# Patient Record
Sex: Male | Born: 1945 | Race: Black or African American | Hispanic: No | Marital: Married | Smoking: Former smoker
Health system: Southern US, Community
[De-identification: ages and names within clinical notes are randomized; demographics above are authoritative.]

## PROBLEM LIST (undated history)

## (undated) DIAGNOSIS — M1990 Unspecified osteoarthritis, unspecified site: Secondary | ICD-10-CM

## (undated) DIAGNOSIS — E119 Type 2 diabetes mellitus without complications: Secondary | ICD-10-CM

## (undated) DIAGNOSIS — I10 Essential (primary) hypertension: Secondary | ICD-10-CM

## (undated) DIAGNOSIS — E785 Hyperlipidemia, unspecified: Secondary | ICD-10-CM

## (undated) DIAGNOSIS — I209 Angina pectoris, unspecified: Secondary | ICD-10-CM

## (undated) HISTORY — DX: Angina pectoris, unspecified: I20.9

## (undated) HISTORY — PX: CARDIAC CATHETERIZATION: SHX172

## (undated) HISTORY — PX: OTHER SURGICAL HISTORY: SHX169

## (undated) HISTORY — DX: Essential (primary) hypertension: I10

## (undated) HISTORY — DX: Hyperlipidemia, unspecified: E78.5

## (undated) HISTORY — PX: ROTATOR CUFF REPAIR: SHX139

---

## 2001-11-20 ENCOUNTER — Encounter: Payer: Self-pay | Admitting: Cardiology

## 2001-11-20 ENCOUNTER — Encounter: Admission: RE | Admit: 2001-11-20 | Discharge: 2001-11-20 | Payer: Self-pay | Admitting: Cardiology

## 2002-03-28 ENCOUNTER — Encounter: Payer: Self-pay | Admitting: Cardiology

## 2002-03-28 ENCOUNTER — Ambulatory Visit (HOSPITAL_COMMUNITY): Admission: RE | Admit: 2002-03-28 | Discharge: 2002-03-28 | Payer: Self-pay | Admitting: Cardiology

## 2003-12-10 ENCOUNTER — Encounter (HOSPITAL_COMMUNITY): Admission: RE | Admit: 2003-12-10 | Discharge: 2004-03-09 | Payer: Self-pay | Admitting: Cardiology

## 2003-12-22 ENCOUNTER — Ambulatory Visit (HOSPITAL_COMMUNITY): Admission: RE | Admit: 2003-12-22 | Discharge: 2003-12-23 | Payer: Self-pay | Admitting: Cardiology

## 2004-01-11 ENCOUNTER — Encounter (HOSPITAL_COMMUNITY): Admission: RE | Admit: 2004-01-11 | Discharge: 2004-03-23 | Payer: Self-pay | Admitting: Cardiology

## 2004-10-26 ENCOUNTER — Ambulatory Visit (HOSPITAL_COMMUNITY): Admission: RE | Admit: 2004-10-26 | Discharge: 2004-10-26 | Payer: Self-pay | Admitting: Gastroenterology

## 2004-11-10 ENCOUNTER — Ambulatory Visit (HOSPITAL_COMMUNITY): Admission: RE | Admit: 2004-11-10 | Discharge: 2004-11-10 | Payer: Self-pay | Admitting: Cardiology

## 2005-04-03 ENCOUNTER — Ambulatory Visit (HOSPITAL_COMMUNITY): Admission: RE | Admit: 2005-04-03 | Discharge: 2005-04-03 | Payer: Self-pay | Admitting: Cardiology

## 2008-03-10 ENCOUNTER — Inpatient Hospital Stay (HOSPITAL_COMMUNITY): Admission: EM | Admit: 2008-03-10 | Discharge: 2008-03-13 | Payer: Self-pay | Admitting: Emergency Medicine

## 2008-08-11 ENCOUNTER — Ambulatory Visit (HOSPITAL_COMMUNITY): Admission: RE | Admit: 2008-08-11 | Discharge: 2008-08-11 | Payer: Self-pay | Admitting: Cardiology

## 2009-12-30 ENCOUNTER — Emergency Department (HOSPITAL_COMMUNITY): Admission: EM | Admit: 2009-12-30 | Discharge: 2009-12-30 | Payer: Self-pay | Admitting: Family Medicine

## 2010-06-26 ENCOUNTER — Ambulatory Visit: Payer: Self-pay | Admitting: Gastroenterology

## 2010-06-26 ENCOUNTER — Ambulatory Visit (HOSPITAL_COMMUNITY): Admission: EM | Admit: 2010-06-26 | Discharge: 2010-06-26 | Payer: Self-pay | Admitting: Emergency Medicine

## 2010-06-26 ENCOUNTER — Emergency Department (HOSPITAL_COMMUNITY): Admission: EM | Admit: 2010-06-26 | Discharge: 2010-06-26 | Payer: Self-pay | Admitting: Emergency Medicine

## 2010-11-24 NOTE — Procedures (Signed)
Summary: Upper Endoscopy  Patient: Logan Cisneros Note: All result statuses are Final unless otherwise noted.  Tests: (1) Upper Endoscopy (EGD)   EGD Upper Endoscopy       DONE     [facility]     [address]     [place], [place]  [address]           ENDOSCOPY PROCEDURE REPORT     PATIENT:  [name], [name]  MR#:  [id number]     BIRTHDATE:  March 29, 1946, 64 yrs. old  GENDER:  male     ENDOSCOPIST:  Judie Petit T. Russella Dar, MD, [facility]           PROCEDURE DATE:  06/26/2010     PROCEDURE:  EGD with foreign body removal, EGD with dilatation     over guidewire     ASA CLASS:  Class II     INDICATIONS:  food impaction, dysphagia     MEDICATIONS:  Fentanyl 75 mcg IV, Versed 7 mg IV     TOPICAL ANESTHETIC:  Cetacaine Spray     DESCRIPTION OF PROCEDURE:   After the risks benefits and     alternatives of the procedure were thoroughly explained, informed     consent was obtained.  The [id number] endoscope was     introduced through the mouth and advanced to the second portion of     the duodenum, limited by Retained food in the stomach.   The     instrument was slowly withdrawn as the mucosa was fully examined.     <<PROCEDUREIMAGES>>     Retained meat was present in the distal esophagus. It was removed     orally with the tripod grasper and the roth basket. There were mid     esophageal abrasions and post pharyngeal abrasions with very mild     bleeding likely from the grasper and roth basket. Retained food     was present in the body of the stomach which limited the exam.     Mild erosive gastritis was found in the antrum. The stomach was     entered and closely examined. The antrum, angularis, and lesser     curvature were fairly well visualized, including a retroflexed     view of the cardia and fundus. The stomach wall was normally     distensable. The scope passed easily through the pylorus into the     duodenum. The duodenal bulb was normal in appearance, as  was the     postbulbar duodenum.  A stricture was found at the     gastroesophageal junction. It was circumferential and benign     appearing. It was 15 mm in diameter, savary / guidewire, 17mm     passed with minimal resistance and moderate heme likley related to     the abrasions described. Retroflexed views revealed no     abnormalities. The scope was then withdrawn from the patient and     the procedure completed.           COMPLICATIONS:  None     ENDOSCOPIC IMPRESSION:     1) Food, retained in the distal esophagus-removed     2) Food, retained in the body of the stomach     3) Mild antral gastritis     4) Stricture at the gastroesophageal junction           RECOMMENDATIONS:     1) Anti-reflux regimen  2) PPI qam; Prilosec 20mg  OTC qam     3) OP follow-up in 2 weeks with Dr. Elnoria Howard     4) Post dilation instructions and gargle with warm salt water     later tonight and tomororw           Kavish Lafitte T. Russella Dar, MD, [facility]           CC:  Jeani Hawking, MD           n.     Rosalie DoctorVenita Lick. Zayah Keilman at 06/26/2010 09:51 PM           [name], [id number]  Note: An exclamation mark (!) indicates a result that was not dispersed into the flowsheet. Document Creation Date: 06/28/2010 9:50 AM _______________________________________________________________________  (1) Order result status: Final Collection or observation date-time: 06/26/2010 21:38 Requested date-time:  Receipt date-time:  Reported date-time:  Referring Physician:   Ordering Physician: Claudette Head [phone]) Specimen Source:  Source: Launa Grill Order Number: [id number] Lab site:

## 2011-03-07 NOTE — Discharge Summary (Signed)
Logan Cisneros, Logan Cisneros             ACCOUNT NO.:  [id number]   MEDICAL RECORD NO.:  [id number]          PATIENT TYPE:  INP   LOCATION:  6525                         FACILITY:  [facility]   PHYSICIAN:  Mohan N. Sharyn Lull, M.D. DATE OF BIRTH:  1946-07-22   DATE OF ADMISSION:  03/10/2008  DATE OF DISCHARGE:  03/13/2008                               DISCHARGE SUMMARY   ADMITTING DIAGNOSES:  1. Chest pain, new-onset angina, rule out myocardial infarction.  2. Status post recent acute upper gastrointestinal bleeding, rule out      peptic ulcer disease, rule out gastritis.  3. Coronary artery disease.  4. Hypertension.  5. Diabetes mellitus.  6. Hypercholesterolemia.  7. Morbid obesity.   DISCHARGE DIAGNOSES:  1. Stable angina, myocardial infarction ruled out.  2. Coronary artery disease, status post percutaneous coronary      intervention to left anterior descending artery in the past.  3. Status post recent acute upper gastrointestinal bleeding secondary      to gastric ulcers.  4. Anemia secondary to above.  5. Hypertension.  6. Non-insulin-dependent diabetes mellitus.  7. Hypercholesterolemia.  8. Morbid obesity.  9. Status post recent nonsteroidal anti-inflammatory drug use.   DISCHARGE HOME MEDICATIONS:  1. Lopressor 25 mg 1 tablet twice daily.  2. Altace 10 mg 1 capsule daily.  3. Lipitor 80 mg 1 tablet daily.  4. Protonix 40 mg 1 tablet daily.  5. Glumetza 500 mg 1 tablet daily with food.  6. Nitrolingual spray 0.4 mg sublingually, use as directed.  7. Feosol 1 tablet twice daily.   ACTIVITY:  As tolerated.   DIET:  Low-salt, low-cholesterol, 1800-calorie ADA diet.   The patient has been advised to avoid heavy meals and any exertion.  The  patient also has been advised to stop aspirin, Plavix, and NSAIDs.  The  patient also has been advised if he feels dizzy, lightheaded, weak, or  notices black tarry stools, he should call 911 immediately.  Follow up  with me in 1 week.   Monitor blood sugar daily.  CBC in 1 week.   CONDITION AT DISCHARGE:  Stable.   BRIEF HISTORY AND HOSPITAL COURSE:  [name] is a 65 year old black  male with past medical history significant for coronary artery disease,  history of MI in the past, status post PTCA with stenting to LAD in  March 2005, hypertension, hypercholesterolemia, morbid obesity, and non-  insulin-dependent diabetes mellitus, complains of retrosternal chest  pain off and on since yesterday described as pressure grade 2-3/10  without associated nausea, vomiting, or diaphoresis.  He states last  week he had black tarry stools and felt weak and dizzy associated with  abdominal discomfort, but did not seek any medical attention.  He states  for the last few days now his stool is brown.  Denies any PND,  orthopnea, or leg swelling.  Denies palpitation, lightheadedness, or  syncope.  He states he took Aleve for few days for left hip pain.  Lately also he states gets short of breath with minimal exertion.  Denies any cough, fevers, or chills.   PAST MEDICAL HISTORY:  As above.   PAST SURGICAL HISTORY:  He had bilateral rotator cuff surgery in the  past and had tonsillectomy at young age.   ALLERGIES:  No known drug allergies.   MEDICATIONS AT HOME:  He was on enteric-coated aspirin 81 mg p.o. daily,  Plavix 75 mg daily, Aleve, he took for few days, stopped last week,  Altace 10 mg daily, metoprolol 25 mg twice daily, Lipitor 80 mg p.o.  daily, and glumetza 500 mg daily.   SOCIAL HISTORY:  He is married, 3 children, smoked 1 pack per week, quit  in 1970s, retired from [employer], presently works as a Runner, broadcasting/film/video at [employer].   FAMILY HISTORY:  Positive for cancer.   PHYSICAL EXAMINATION:  GENERAL:  On examination, he was alert, awake,  oriented x3, in no acute distress.  VITAL SIGNS:  Blood pressure was 124/72 and pulse was 65 and regular.  HEENT:  Conjunctivae pink.  NECK:  Supple.  No JVD.  No bruits.  LUNGS:   Clear to auscultation without rhonchi and rales.  CARDIOVASCULAR:  S1 and S2 was normal.  There was a soft systolic  murmur.  There was no S3 gallop.  ABDOMEN:  Soft.  Bowel sounds are present.  Nontender.  EXTREMITIES:  There is no clubbing, cyanosis, or edema.   LABORATORY DATA:  His hemoglobin was 9.5, hematocrit 28.4, and white  count of 8.5.  Glucose 115.  Two sets of cardiac enzymes were negative.  EKG showed normal sinus rhythm with no acute ischemic changes.   BRIEF HOSPITAL COURSE:  The patient was admitted to telemetry unit.  MI  was ruled out by serial enzymes and EKG.  Due to recent acute upper GI  bleeding, GI consultation was obtained with Dr. Loreta Ave.  The patient  subsequently underwent upper endoscopy, which showed gastric ulcers and  duodenitis.  The patient's aspirin and Plavix were stopped.  Also  heparin was stopped.  The patient did not have any episodes of GI  bleeding during the hospital stay.  His hemoglobin remained stable  during the hospital stay.  The patient is started on PPI and Feosol.  The patient did not have any further episodes of chest pain during the  hospital stay.  His EKG remained stable.  The  patient is eager to go home, will be discharged home, and will be  followed up in my office in 1 week.  We will discuss with the patient an  outpatient cardiac workup, i.e., noninvasive stress testing versus left  cath in few weeks once the gastric ulcer is healed.      Eduardo Osier. Sharyn Lull, M.D.  Electronically Signed     MNH/MEDQ  D:  03/13/2008  T:  03/14/2008  Job:  [id number]

## 2011-03-07 NOTE — Consult Note (Signed)
NAME:  Logan Cisneros, Logan Cisneros             ACCOUNT NO.:  [id number]   MEDICAL RECORD NO.:  [id number]          PATIENT TYPE:  INP   LOCATION:  6525                         FACILITY:  [facility]   PHYSICIAN:  Anselmo Rod, M.D.  DATE OF BIRTH:  07-09-1946   DATE OF CONSULTATION:  DATE OF DISCHARGE:                                 CONSULTATION   CHIEF COMPLAINT:  Chest pain.   HISTORY OF PRESENT ILLNESS:  The patient is a 65 year old male with past  medical history significant for hypertension, MI status post PCI,  hyperlipidemia, DM, and obesity presenting to [facility] with substernal  chest pain.  The patient's cardiac enzymes are negative thus far with no  change in EKG.  GI is being consulted for anemia in setting of melena.  Per the patient, he started having black stools this past Thursday  through Saturday without associated abdominal pain, hematochezia,  hematemesis, or fevers.  The patient only complained of nausea during  the episodes of melena.  The patient reports one bowel movement per day  that is not brown.  Of note, the patient has been taking 3 Aleve per day  without his physician's knowledge.  The patient has been taking Aleve  for 8 months now, and stopped it when he noticed the black stools this  past Thursday.   PAST SURGICAL HISTORY:  1. Tonsillectomy.  2. Bilateral rotator cuff repair.   PAST MEDICAL HISTORY:  1. Diabetes mellitus.  2. Hypertension.  3. Hyperlipidemia.  4. CAD status post MI with PCI.  5. Obesity.   ALLERGIES:  CODEINE.   FAMILY HISTORY:  No history of esophageal, liver, gastric, or colon  cancer in the family.   HOSPITAL MEDICATIONS:  1. Aspirin 81 mg daily.  2. Sliding scale insulin.  3. Lopressor.  4. Protonix.  5. Altace.  6. Crestor.  7. Nitro.   SOCIAL HISTORY:  The patient is married with a remote history of tobacco  abuse, quit in 1978, denies alcohol, positive for NSAIDS.   PHYSICAL EXAMINATION:  VITALS:  Temperature equals  98.1, pulse equals  74, respiratory rate equals 17, blood pressure equals 120/70, and O2  sats equals 99% on room air.  GENERAL:  No acute distress.  HEENT:  AT, [place], moist mucous membranes, no erythema.  CV:  S1, S2, regular rate and rhythm.  LUNGS:  CTA bilaterally.  ABDOMEN:  Soft, nontender, positive bowel sounds, no guarding, no  rebound, Murphy sign negative.  RECTAL:  Brown stool, hemorrhoids, FOBT negative, normal tone.  EXTREMITIES:  No edema.  NEURO:  Nonfocal.   LABORATORY DATA:  Reticulocyte percentage equals 2.6, RBC equals 3.23,  absolute reticulocytes equals 84.0, ferritin equals 145, hemoglobin A1c  equals 5.9, heparin equals 1.44.  Point of care markers negative x1.  Cardiac enzymes negative x 2.  Sodium 138, potassium 4.2, chloride 107,  bicarb 27, BUN 11, creatinine 1.09, glucose 111, calcium 8.5, anion gap  4.  WBC 7.9, hemoglobin 9.0, which was 10.2 on admission, hematocrit  26.3, platelets 203, MCV 86.5, RDW 15.4, PTT equals 29, PT equals 13,  INR equals 1.0.  FOBT negative.  Chest x-ray, no active disease.   ASSESSMENT/PLAN:  Gastrointestinal bleed likely secondary to Aleve  usage.  The patient is not currently actively bleeding.  Agree with  holding Plavix and heparin.  May continue aspirin.  The patient has been  scheduled to get an esophagogastroduodenoscopy at 2 p.m. at [facility]  on Mar 12, 2008.  The patient had a colonoscopy in 2008 that was  reportedly normal per patient.  However, the patient does not remember  the doctor or the group that performed this procedure.  He will continue  Protonix and monitor CBC daily unless the patient starts re-bleeding.  I  also spoke with Dr. Sharyn Lull who will likely push cardiac catheter back  to Friday.  Dr. Elnoria Howard to see the patient later today.      Rufina Falco, M.D.  Electronically Signed      Anselmo Rod, M.D.  Electronically Signed    JY/MEDQ  D:  03/11/2008  T:  03/12/2008  Job:  [id number]   cc:    Jordan Hawks. Elnoria Howard, MD  Eduardo Osier. Sharyn Lull, M.D.

## 2011-03-10 NOTE — Op Note (Signed)
Logan Cisneros, Logan Cisneros             ACCOUNT NO.:  [id number]   MEDICAL RECORD NO.:  [id number]          PATIENT TYPE:  AMB   LOCATION:  ENDO                         FACILITY:  [facility]   PHYSICIAN:  Petra Kuba, M.D.    DATE OF BIRTH:  04/08/1946   DATE OF PROCEDURE:  10/26/2004  DATE OF DISCHARGE:                                 OPERATIVE REPORT   PROCEDURE:  Colonoscopy.   INDICATIONS:  Screening.  Consent was signed after risks, benefits, methods,  and options thoroughly discussed in the office.   MEDICINES USED:  Demerol 70 mg, Versed 7.5 mg.   PROCEDURE:  Rectal inspection was pertinent for external hemorrhoids.  Digital exam was negative.  The video colonoscope was inserted, easily  advanced around the colon to the cecum.  This did not require any abdominal  pressure or any position changes.  No abnormality was seen on insertion.  The scope was inserted a very short way into the terminal ileum, which was  normal on quick evaluation.  Photo documentation was obtained, and the scope  was slowly withdrawn.  On slow withdrawal through the colon, the prep was  adequate.  There was some liquid stool that required washing and suctioning  but no polyps, masses, diverticula, or other abnormalities were seen as we  slowly withdrew back to the rectum.  Anorectal pull-through and retroflexion  confirmed the hemorrhoids, also revealed a small anal papilla.  The scope  was straightened and readvanced a short way up the left side of the colon,  air was suctioned, the scope removed.  The patient tolerated the procedure  well.  There was no obvious immediate complication.   ENDOSCOPIC DIAGNOSES:  1.  Internal-external hemorrhoids with a small anal papilla.  2.  Otherwise within normal limits to the cecum and a quick look at the      terminal ileum.   PLAN:  Happy to see back p.r.n.  Probably recheck colon screening in five  years if doing well medically.  Otherwise, yearly rectals and  guaiacs per  Dr. Shana Chute and other health care maintenance.       MEM/MEDQ  D:  10/26/2004  T:  10/26/2004  Job:  [id number]   cc:   Osvaldo Shipper. Spruill, M.D.  [address] [address]  [place]  [place] [address]  Fax: [phone]   Eduardo Osier. Sharyn Lull, M.D.  [address]  [place]  [place] [address]  Fax: [phone]

## 2011-03-10 NOTE — Cardiovascular Report (Signed)
NAME:  ROLONDO, PIERRE                       ACCOUNT NO.:  [id number]   MEDICAL RECORD NO.:  [id number]                   PATIENT TYPE:  OIB   LOCATION:  6533                                 FACILITY:  [facility]   PHYSICIAN:  Mohan N. Sharyn Lull, M.D.              DATE OF BIRTH:  [birth year]   DATE OF PROCEDURE:  12/22/2003  DATE OF DISCHARGE:  12/23/2003                              CARDIAC CATHETERIZATION   PROCEDURE:  1. Left cardiac catheterization and selective right and left coronary     angiography, left ventriculography via the right groin using Judkins     technique.  2. Successful percutaneous transluminal coronary angioplasty to the proximal     left anterior descending using 2.0 x 15 mm long Maverick balloon.  3. Successful percutaneous transluminal coronary angioplasty to distal left     anterior descending using 2.0 x 15 mm long Maverick balloon.  4. Successful deployment of 2.5 x 18 mm long Cypher drug-eluting stent in     the distal left anterior descending.  5. Successful deployment of 2.5 x 13 mm long Cypher drug-eluting stent in     the distal left anterior descending overlapping the prior stent.  6. Successful deployment of 3.0 x 33 mm long Cypher drug-eluting stent in     the proximal left anterior descending.   CARDIOLOGISTEduardo Osier Sharyn Lull, M.D.   INDICATIONS:  Mr. Highfill is a 65 year old black male with a past medical  history significant for coronary artery disease, status percutaneous  transluminal coronary angioplasty to the LAD in 1996, hypertension,  hypercholesterolemia, morbid obesity.  He complains of retrosternal chest  pain described as burning and heaviness, rated 8/10, associated with left  arm pain with minimal exertion relieved with rest and sublingual  nitroglycerin.  He states he also got short of breath with minimal exertion  from walking from the parking lot to the hospital and needed to stop.  The  patient  underwent stress Cardiolite on  December 10, 2003.  This showed  anteroseptal wall ischemia with an ejection fraction of 52%.  Due to typical  angina chest pain and positive stress Cardiolite and various risk factors,  discussed with the patient regarding left catheterization and possible PTCA  and stenting, its risks, i.e. death, myocardial infarction, stroke and need  for emergency coronary artery bypass graft, risk of restenosis, localized  complications, etc., and consent for PCI.   DESCRIPTION OF PROCEDURE:  After obtaining the informed consent, the patient  was brought to the catheterization lab and was placed on the fluoroscopy  table.  The right groin was prepped and draped in the usual fashion.  Xylocaine 2% was used for local anesthesia in the right groin.  With the  help of a thin wall needle, a 6 French arterial sheath was placed.  The  sheath was aspirated and flushed.  Next, 6 French left Judkins catheter was  advanced over the wire  under fluoroscopic guidance up to the ascending  aorta.  The wire was pulled out.  The catheter was aspirated and connected  to the manifold.  It was further advanced and engaged into the left coronary  ostium.  Multiple views of the left system were taken.  Next, the catheter  was disengaged and was pulled out over the wire and replaced with a 6 French  right Judkins catheter which was advanced over the wire under fluoroscopic  guidance to the ascending aorta.  The wire was pulled out.  The catheter was  aspirated and connected to the manifold.  The catheter was further advanced  and engaged into the right coronary ostium.  Multiple views of the right  system were taken.  Next, the catheter was disengaged and was pulled out  over the wire and was replaced with a 6 French pigtail catheter which was  advanced over the wire under fluoroscopic guidance to the ascending aorta.  The wire was pulled out.  The catheter was aspirated and connected to the  manifold.  The catheter was  further advanced across the aortic valve into  the LV.  The LV pressures were recorded.  Next left ventriculography was  done in 30 degree RAO position.  Post angiographic pressures are recorded  from LV and then pullback pressures were recorded from the aorta.  There was  no gradient across the aortic valve.   Next, the pigtail catheter was pulled out over the wire.  Sheaths were  aspirated and flushed.   FINDINGS:  1. Left ventricle show anterolateral hypokinesia, ejection fraction of 50-     55%.  2. The left main was patent.  3. The left anterior descending had 99% long proximal subtotal stenosis with     TIMI-2 distal flow.  This vessel was filling distally form collaterals     from the right coronary artery.  4. The left circumflex has a 10% ostial stenosis and 30-40% distal stenosis.  5. The first obtuse marginal was large which was patent.  6. The right coronary artery has 30-40% mid sequential stenosis.   INTERVENTIONAL PROCEDURE:  Successful percutaneous transluminal coronary  angioplasty to proximal left anterior descending was done using 2.0 x 15 mm  long Maverick dilator for predilatation.  Angiogram showed 80-85% distal  left anterior descending stenosis and 40-50% mid stenosis.  Then successful  percutaneous transluminal coronary angioplasty to distal LAD was done using  same 2.0 x 15 mm long balloon for predilatation and 2.5 x 18 mm long drug-  eluting stent was deployed at 10 atmospheres of pressure which was fully  expanded up to 15 atmospheres.  The angiogram showed 70% lesion distal to  the stent and despite giving IC nitroglycerin.  Then 2.5 x 13 mm Cypher drug-  eluting stent was deployed at the end of the procedure and the distal LAD  overlapping with the stent with 14 atmospheres of pressure which was fully  expanded going to up 16 atmospheres of pressure.  The lesion was dilated from 80 to 0% ostial and then 3.0 x 33 mm long Cypher drug-eluting stent was   deployed in the proximal LAD at 11 atmospheres of pressure which was fully  expanded going up to 17 atmospheres of pressure.  The lesion was dilated  from 99% to 0% residual with TIMI-3 flow without evidence of dissection or  distal embolization.  The patient received weight based heparin, Integrilin  and 300 mg of Plavix during the procedure.  The  patient tolerated the  procedure well.  There were no complications.  The patient was transferred  to the recovery room in stable condition.                                               Eduardo Osier. Sharyn Lull, M.D.    MNH/MEDQ  D:  12/22/2003  T:  12/23/2003  Job:  [id number]   cc:   Osvaldo Shipper. Spruill, M.D.  [address] [address]  [place]  [place] [address]  Fax: [phone]   Catheterization Lab

## 2011-03-10 NOTE — Discharge Summary (Signed)
NAME:  Logan Cisneros, Logan Cisneros                       ACCOUNT NO.:  [id number]   MEDICAL RECORD NO.:  [id number]                   PATIENT TYPE:  OIB   LOCATION:  6533                                 FACILITY:  [facility]   PHYSICIAN:  Mohan N. Sharyn Lull, M.D.              DATE OF BIRTH:  November 28, 1945   DATE OF ADMISSION:  12/22/2003  DATE OF DISCHARGE:  12/23/2003                                 DISCHARGE SUMMARY   ADMITTING DIAGNOSIS:  1. New onset angina.  2. Positive stress Cardiolite.  3. Hypertension.  4. Coronary artery disease.  5. History of percutaneous transluminal coronary angioplasty in the past.  6. Hypercholesterolemia.  7. Morbid obesity.   DISCHARGE DIAGNOSES:  1. New onset angina, status post percutaneous transluminal coronary     angioplasty stenting to subtotal left anterior descending artery (LAD).  2. Hypertension.  3. Coronary artery disease.  4. History of PTCA to LAD in the past in 1996.  5. Hypercholesterolemia.  6. Morbid obesity.  7. Glucose intolerance controlled by diet.   DISCHARGE HOME MEDICATIONS:  1. Metoprolol 50 mg 1/2 tablet q.12h.  2. Altace 5 mg 1 capsule daily.  3. Enteric coated aspirin 325 mg 1 tablet daily.  4. Plavix 75 mg 1 tablet daily with food.  5. Vytorin 10/40 mg 1 tablet daily.  6. Nitrostat sublingual 0.4 mg use as directed.   DISCHARGE INSTRUCTIONS:  1. Activity:  Avoid heavy lifting, pushing or pulling for 48 hours.  2. Low salt, low cholesterol, 1800 calories ADA diet.  3. The patient has been advised to avoid cheese.  4. _____________ stent instructions have been given.  5. The patient has been advised to drink p.o. fluids.  6. Phase 2 cardiac rehab will be scheduled as an outpatient.  7. BNP in 1 week.  8. Follow up with me in 1 week and Dr. Shana Chute in 2 weeks.   BRIEF HOSPITAL COURSE:  Mr. Saling is a 74 black male with a past medical  history significant for coronary artery disease, status post PTCA to LAD in  1996,  hypertension, hypercholesterolemia, morbid obesity; complaints of  retrosternal chest pain described as burning and heaviness grade 8/10  associated with left arm pain with minimal exertion.  Released with rest and  sublingual nitroglycerin.  States he also got short of breath with minimal  exertion from walking from parking lot to the hospital and need to stop in  between.  The patient had underwent a stress Cardiolite on December 10, 2003  which showed anteroseptal wall ischemia with EF of 52%, due to anginal chest  pain, positive Cardiolite and various risk factors discussed with patient  regarding LCP and consent given for the procedure.   PAST MEDICAL HISTORY:  As above.   PAST SURGICAL HISTORY:  He had left rotator cuff surgery in 1998, had right  rotator cuff surgery in July 2004.   ALLERGIES:  He is allergic to  codeine.   MEDICATIONS AT HOME:  1. He is on metoprolol 50 mg 1/2 tablet daily.  2. Benicar 40/25 p.o. daily.  3. Vytorin 10/20 daily which he stopped.  4. Baby aspirin 81 mg p.o. daily.  5. Garlic supplement.   SOCIAL HISTORY:  He is divorced; has 2 children; smoked 1 pack per week for  13 years, quit in 1978; used to drink socially and quit in 1978.  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   FAMILY HISTORY:  Father died of prostate cancer at the age of 64.  Mother  died of stomach tumor at the age of 31 and brother has asthma.   PHYSICAL EXAMINATION:  Logan Cisneros:  On examination he is alert, awake, oriented  x3 in no acute distress.  VITAL SIGNS:  Blood pressure is 140/80.  Pulse was 72 and regular.  HEENT:  Conjunctiva was pink.  NECK:  Supple.  No JVD, no bruits.  LUNGS:  Clear to auscultation without rhonchi.  CARDIOVASCULAR:  S1-S2 was normal.  There was no S3, gallop or murmur.  ABDOMEN:  Soft.  Bowel sounds were present, nontender.  EXTREMITIES:  There was no clubbing, cyanosis or edema.   IMPRESSION:   ____________ angina, positive stress Cardiolite.  1. Coronary artery disease.  2. Hypertension.  3. Hypercholesterolemia.  4. Morbid obesity.  5. Remote history of tobacco abuse.   HOSPITAL COURSE:  Discussed with patient, at length, regarding left cardiac  catheterization, possible PTCA stenting and stress, i.e., stroke and  consented of the procedure.  The patient was an a.m. admit and subsequently  underwent left cardiac catheterization with left and right coronary  angiography and PTCA and stenting to the LAD, subtotal occluded LAD as per  procedure report.  The patient tolerated procedure well.  There were no  complications.  Postprocedure his labs:  Hemoglobin was 11.8; hematocrit  34.3.  Glucose was slightly elevated 125.  CPK was 194, MB of 1.4 which was  negative. His hemoglobin A1c was 6.7.  BUN was 19; creatinine was 1.8.  Repeat BNP:  BUN was 19, creatinine has come down to 1.5, glucose is 100,  potassium is 3.7.  The patient did have any episodes of chest pain during  the hospital stay.  His groin is stable.  There is no evidence of hematoma  or bruits.  Phase 1  cardiac rehab and phase 2 rehab was called.  The patient has been ambulating  in hallway without any problems.  The patient will be discharged home on the  above medications and will be followed up in my office in 1 week and with  Dr. Shana Chute in 2 weeks.                                                Eduardo Osier. Sharyn Lull, M.D.    [facility]MED[facility]  D:  12/23/2003  T:  12/24/2003  Job:  [id number]   cc:   Osvaldo Shipper. Spruill, M.D.  [address] [id number]  [place]  [place] [address]  Fax: [phone]

## 2011-07-19 LAB — IRON AND TIBC
Iron: 48
TIBC: 254
UIBC: 206

## 2011-07-19 LAB — FERRITIN: Ferritin: 145 (ref 22–322)

## 2011-07-19 LAB — CBC
HCT: 28.4 — ABNORMAL LOW
Hemoglobin: 9 — ABNORMAL LOW
MCHC: 34.3
MCHC: 34.5
MCV: 86.2
MCV: 86.2
Platelets: 224
Platelets: 228
Platelets: 242
RBC: 3.04 — ABNORMAL LOW
RBC: 3.35 — ABNORMAL LOW
RDW: 15.3
RDW: 15.7 — ABNORMAL HIGH
WBC: 8.1
WBC: 8.5

## 2011-07-19 LAB — POCT I-STAT, CHEM 8
BUN: 15
Calcium, Ion: 1.24
Chloride: 102
HCT: 30 — ABNORMAL LOW
Potassium: 4.6
Sodium: 138

## 2011-07-19 LAB — POCT CARDIAC MARKERS
Myoglobin, poc: 88.9
Operator id: 277751
Troponin i, poc: <0.05

## 2011-07-19 LAB — PROTIME-INR
INR: 1
Prothrombin Time: 13

## 2011-07-19 LAB — CK TOTAL AND CKMB (NOT AT ARMC)
Relative Index: 1.1
Total CK: 144

## 2011-07-19 LAB — RETICULOCYTES: Retic Count, Absolute: 84

## 2011-07-19 LAB — DIFFERENTIAL
Basophils Absolute: 0.1
Lymphocytes Relative: 33
Lymphs Abs: 2.8
Neutro Abs: 4.6

## 2011-07-19 LAB — COMPREHENSIVE METABOLIC PANEL
Albumin: 3.4 — ABNORMAL LOW
BUN: 13
CO2: 27
Calcium: 8.8
Chloride: 103
Creatinine, Ser: 1.17
GFR calc non Af Amer: >60
Total Bilirubin: 0.4

## 2011-07-19 LAB — BASIC METABOLIC PANEL
BUN: 11
BUN: 12
BUN: 15
CO2: 27
Calcium: 8.5
Calcium: 9.2
Calcium: 9.5
Chloride: 105
Creatinine, Ser: 1.26
Creatinine, Ser: 1.29
GFR calc Af Amer: >60
Glucose, Bld: 111 — ABNORMAL HIGH
Potassium: 4.4
Sodium: 138
Sodium: 139

## 2011-07-19 LAB — CARDIAC PANEL(CRET KIN+CKTOT+MB+TROPI)
CK, MB: 1.2
CK, MB: 1.2
Relative Index: 1

## 2011-07-19 LAB — HEMOGLOBIN A1C: Hgb A1c MFr Bld: 5.9

## 2011-07-19 LAB — APTT
aPTT: 29
aPTT: 31

## 2011-07-19 LAB — TROPONIN I: Troponin I: 0.02

## 2012-06-07 ENCOUNTER — Other Ambulatory Visit: Payer: Self-pay | Admitting: Cardiology

## 2013-07-23 ENCOUNTER — Encounter: Payer: Self-pay | Admitting: Podiatry

## 2013-07-23 ENCOUNTER — Ambulatory Visit (INDEPENDENT_AMBULATORY_CARE_PROVIDER_SITE_OTHER): Payer: BC Managed Care – PPO

## 2013-07-23 ENCOUNTER — Ambulatory Visit (INDEPENDENT_AMBULATORY_CARE_PROVIDER_SITE_OTHER): Payer: BC Managed Care – PPO | Admitting: Podiatry

## 2013-07-23 VITALS — BP 158/85 | HR 60 | Resp 16 | Ht 71.0 in | Wt 260.0 lb

## 2013-07-23 DIAGNOSIS — M25579 Pain in unspecified ankle and joints of unspecified foot: Secondary | ICD-10-CM

## 2013-07-23 DIAGNOSIS — M2040 Other hammer toe(s) (acquired), unspecified foot: Secondary | ICD-10-CM

## 2013-07-23 DIAGNOSIS — M25571 Pain in right ankle and joints of right foot: Secondary | ICD-10-CM

## 2013-07-23 DIAGNOSIS — L84 Corns and callosities: Secondary | ICD-10-CM

## 2013-07-23 NOTE — Patient Instructions (Addendum)
Return when pain in toes or calluses return

## 2013-07-23 NOTE — Progress Notes (Signed)
Subjective:     Patient ID: Logan Cisneros, male   DOB: Oct 13, 1946, 67 y.o.   MRN: [id number]  HPIcomplaining of ankle pain right and discoloration of toes on the right foot   Review of Systems  All other systems reviewed and are negative.       Objective:   Physical Exam  Cardiovascular: Intact distal pulses.    neurological was found to be intact. I noted there to be moderate swelling of the right ankle and discoloration of the forefoot right foot with moderate pain occurring of the metatarsal shafts. Range of motion of the subtalar joint adequate bilateral. Reduced range of motion noted of the ankle joint right. Moderate equinus condition noted.     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 Trauma to the right forefoot which is created discoloration and discomfort of recent duration.    Plan:     Initial H&P performed with instructions. Evaluated ankle and foot condition and reviewed x-rays. Discussed trauma to the forefoot right and recommended that he soak in warm water in the evening and that discoloration should gradually improve. Patient to be seen back as needed

## 2013-07-23 NOTE — Progress Notes (Signed)
N-aches L-lateral ankle right D-April 06, 2011 O-sudden C-injury-slipped down stairs at work, area swollen and discolored A-walking  T-no treatment

## 2013-12-24 ENCOUNTER — Ambulatory Visit (INDEPENDENT_AMBULATORY_CARE_PROVIDER_SITE_OTHER): Payer: Medicare Other

## 2013-12-24 ENCOUNTER — Ambulatory Visit (INDEPENDENT_AMBULATORY_CARE_PROVIDER_SITE_OTHER): Payer: Medicare Other | Admitting: Podiatry

## 2013-12-24 ENCOUNTER — Encounter: Payer: Self-pay | Admitting: Podiatry

## 2013-12-24 VITALS — BP 154/75 | HR 76 | Resp 16

## 2013-12-24 DIAGNOSIS — M79673 Pain in unspecified foot: Secondary | ICD-10-CM

## 2013-12-24 DIAGNOSIS — M109 Gout, unspecified: Secondary | ICD-10-CM

## 2013-12-24 DIAGNOSIS — M779 Enthesopathy, unspecified: Secondary | ICD-10-CM

## 2013-12-24 DIAGNOSIS — M79609 Pain in unspecified limb: Secondary | ICD-10-CM

## 2013-12-24 MED ORDER — TRIAMCINOLONE ACETONIDE 10 MG/ML IJ SUSP
10.0000 mg | Freq: Once | INTRAMUSCULAR | Status: AC
  Administered 2013-12-24: 10 mg

## 2013-12-25 NOTE — Progress Notes (Signed)
Subjective:     Patient ID: Logan Cisneros, male   DOB: 02-11-1946, 68 y.o.   MRN: [id number]  HPI patient points to right foot stating that this big toe joint has become very red and swollen and he has never had gout but it seems to be that way based on the way it's acting   Review of Systems     Objective:   Physical Exam Neurovascular status intact with patient well oriented x3 and inflammation redness and swelling around the first MPJ right with no drainage or indications of proximal edema erythema or lymph node distention    Assessment:     Probable gout with inflammatory capsulitis first MPJ right    Plan:     Educated patient on condition and injected the right first MPJ 3 mg Kenalog 5 mg Xylocaine Marcaine mixture and advised if it is not improved we'll start him on oral medication and get blood work. Patient was given sheets on gout and what to do concerning diet

## 2014-04-06 ENCOUNTER — Emergency Department (HOSPITAL_COMMUNITY)
Admission: EM | Admit: 2014-04-06 | Discharge: 2014-04-06 | Disposition: A | Discharge status: Home / Self Care | Payer: Medicare Other | Attending: Emergency Medicine | Admitting: Emergency Medicine

## 2014-04-06 ENCOUNTER — Other Ambulatory Visit (HOSPITAL_COMMUNITY): Payer: Self-pay | Admitting: Cardiology

## 2014-04-06 ENCOUNTER — Encounter (HOSPITAL_COMMUNITY): Payer: Self-pay | Admitting: Emergency Medicine

## 2014-04-06 ENCOUNTER — Emergency Department (HOSPITAL_COMMUNITY): Payer: Medicare Other

## 2014-04-06 DIAGNOSIS — E785 Hyperlipidemia, unspecified: Secondary | ICD-10-CM | POA: Insufficient documentation

## 2014-04-06 DIAGNOSIS — R079 Chest pain, unspecified: Secondary | ICD-10-CM

## 2014-04-06 DIAGNOSIS — I209 Angina pectoris, unspecified: Secondary | ICD-10-CM | POA: Insufficient documentation

## 2014-04-06 DIAGNOSIS — Y9389 Activity, other specified: Secondary | ICD-10-CM | POA: Insufficient documentation

## 2014-04-06 DIAGNOSIS — I10 Essential (primary) hypertension: Secondary | ICD-10-CM | POA: Insufficient documentation

## 2014-04-06 DIAGNOSIS — Z87891 Personal history of nicotine dependence: Secondary | ICD-10-CM | POA: Insufficient documentation

## 2014-04-06 DIAGNOSIS — Y929 Unspecified place or not applicable: Secondary | ICD-10-CM | POA: Insufficient documentation

## 2014-04-06 DIAGNOSIS — E119 Type 2 diabetes mellitus without complications: Secondary | ICD-10-CM | POA: Insufficient documentation

## 2014-04-06 DIAGNOSIS — IMO0002 Reserved for concepts with insufficient information to code with codable children: Secondary | ICD-10-CM | POA: Insufficient documentation

## 2014-04-06 DIAGNOSIS — T18108A Unspecified foreign body in esophagus causing other injury, initial encounter: Secondary | ICD-10-CM | POA: Insufficient documentation

## 2014-04-06 DIAGNOSIS — Z7982 Long term (current) use of aspirin: Secondary | ICD-10-CM | POA: Insufficient documentation

## 2014-04-06 DIAGNOSIS — Z9889 Other specified postprocedural states: Secondary | ICD-10-CM | POA: Insufficient documentation

## 2014-04-06 DIAGNOSIS — Z79899 Other long term (current) drug therapy: Secondary | ICD-10-CM | POA: Insufficient documentation

## 2014-04-06 HISTORY — DX: Type 2 diabetes mellitus without complications: E11.9

## 2014-04-06 NOTE — ED Notes (Signed)
Pt. reported a piece of apple stuck in his throat this afternoon , vomitted x1 , respirations unlabored / airway intact .

## 2014-04-06 NOTE — Discharge Instructions (Signed)
Swallowed Foreign Body, Adult °You have swallowed an object (foreign body). Once the foreign body has passed through the food tube (esophagus), which leads from the mouth to the stomach, it will usually continue through the body without problems. This is because the point where the esophagus enters into the stomach is the narrowest place through which the foreign body must pass. °Sometimes the foreign body gets stuck. The most common type of foreign body obstruction in adults is food impaction. Many times, bones from fish or meat products may become lodged in the esophagus or injure the throat on the way down. When there is an object that obstructs the esophagus, the most obvious symptoms are pain and the inability to swallow normally. In some cases, foreign bodies that can be life threatening are swallowed. Examples of these are certain medications and illicit drugs. Often in these instances, patients are afraid of telling what they swallowed. However, it is extremely important to tell the emergency caregiver what was swallowed because life-saving treatment may be needed.  °X-ray exams may be taken to find the location of the foreign body. However, some objects do not show up well or may be too small to be seen on an X-ray image. If the foreign body is too large or too sharp, it may be too dangerous to allow it to pass on its own. You may need to see a caregiver who specializes in the digestive system (gastroenterologist). In a few cases, a specialist may need to remove the object using a method called "endoscopy".  This involves passing a thin, soft, flexible tube into the food pipe to locate and remove the object. Follow up with your primary doctor or the referral you were given by the emergency caregiver. °HOME CARE INSTRUCTIONS  °· If your caregiver says it is safe for you to eat, then only have liquids and soft foods until your symptoms improve. °· Once you are eating normally: °· Cut food into small  pieces. °· Remove small bones from food. °· Remove large seeds and pits from fruit. °· Chew your food well. °· Do not talk, laugh, or engage in physical activity while eating or swallowing. °SEEK MEDICAL CARE IF: °· You develop worsening shortness of breath, uncontrollable coughing, chest pains or high fever, greater than 102° F (38.9° C). °· You are unable to eat or drink or you feel that food is getting stuck in your throat. °· You have choking symptoms or cannot stop drooling. °· You develop abdominal pain, vomiting (especially of blood), or rectal bleeding. °MAKE SURE YOU:  °· Understand these instructions. °· Will watch your condition. °· Will get help right away if you are not doing well or get worse. °Document Released: 03/29/2010 Document Revised: 01/01/2012 Document Reviewed: 03/29/2010 °ExitCare® Patient Information ©2014 [employer]. ° °

## 2014-04-08 NOTE — ED Provider Notes (Signed)
CSN: [id number]     Arrival date & time 04/06/14  1900 History   First MD Initiated Contact with Patient 04/06/14 2304     Chief Complaint  Patient presents with  . Foreign Body     (Consider location/radiation/quality/duration/timing/severity/associated sxs/prior Treatment) HPI Patient states he was eating an apple earlier this afternoon and felt he got it stuck in his throat. This happened in the past which required endoscopy. The sensation of foreign body has not passed. He's been able to drink and heat without difficulty. He denies any pain at this point. Past Medical History  Diagnosis Date  . Anginal pain   . Hypertension   . Hyperlipidemia   . Diabetes mellitus without complication    Past Surgical History  Procedure Laterality Date  . Cardiac catheterization    . Rotator cuff repair Bilateral    Family History  Problem Relation Age of Onset  . Cancer - Prostate Father    History  Substance Use Topics  . Smoking status: Former Smoker    Types: Cigarettes  . Smokeless tobacco: Not on file  . Alcohol Use: No    Review of Systems  Constitutional: Negative for fever and chills.  HENT: Negative for trouble swallowing and voice change.   Respiratory: Negative for cough, shortness of breath, wheezing and stridor.   Cardiovascular: Negative for chest pain, palpitations and leg swelling.  Gastrointestinal: Negative for nausea, vomiting, abdominal pain, diarrhea and constipation.  Musculoskeletal: Negative for back pain, myalgias, neck pain and neck stiffness.  Skin: Negative for rash and wound.  Neurological: Negative for dizziness, weakness, light-headedness, numbness and headaches.  All other systems reviewed and are negative.     Allergies  Review of patient's allergies indicates no known allergies.  Home Medications   Prior to Admission medications   Medication Sig Start Date End Date Taking? Authorizing Provider  aspirin 81 MG tablet Take 81 mg by mouth  daily.   Yes Historical Provider, MD  atorvastatin (LIPITOR) 80 MG tablet Take 80 mg by mouth once.    Yes Historical Provider, MD  clopidogrel (PLAVIX) 75 MG tablet Take 75 mg by mouth daily. Pt takes 1/2 pill   Yes Historical Provider, MD  metoprolol tartrate (LOPRESSOR) 25 MG tablet Take 25 mg by mouth 2 (two) times daily.   Yes Historical Provider, MD  ramipril (ALTACE) 10 MG capsule Take 10 mg by mouth 2 (two) times daily.    Yes Historical Provider, MD   BP 142/90  Pulse 69  Temp(Src) 98.4 F (36.9 C) (Oral)  Resp 16  Ht 5\' 11"  (1.803 m)  Wt 264 lb (119.75 kg)  BMI 36.84 kg/m2  SpO2 96% Physical Exam  Nursing note and vitals reviewed. Constitutional: He is oriented to person, place, and time. He appears well-developed and well-nourished. No distress.  HENT:  Head: Normocephalic and atraumatic.  Mouth/Throat: Oropharynx is clear and moist.  Eyes: EOM are normal. Pupils are equal, round, and reactive to light.  Neck: Normal range of motion. Neck supple.  Cardiovascular: Normal rate and regular rhythm.  Exam reveals no gallop and no friction rub.   No murmur heard. Pulmonary/Chest: Effort normal and breath sounds normal. No stridor. No respiratory distress. He has no wheezes. He has no rales. He exhibits no tenderness.  Abdominal: Soft. Bowel sounds are normal.  Musculoskeletal: Normal range of motion. He exhibits no edema and no tenderness.  Neurological: He is alert and oriented to person, place, and time.  Skin: Skin is warm  and dry. No rash noted. No erythema.  Psychiatric: He has a normal mood and affect. His behavior is normal.    ED Course  Procedures (including critical care time) Labs Review Labs Reviewed - No data to display  Imaging Review Dg Chest 2 View  04/06/2014   CLINICAL DATA:  Patient states he swallowed and apple which is stuck in his throat  EXAM: CHEST  2 VIEW  COMPARISON:  06/26/2010  FINDINGS: The heart size and mediastinal contours are within normal  limits. Both lungs are clear. The visualized skeletal structures are unremarkable.  IMPRESSION: No active cardiopulmonary disease.   Electronically Signed   By: Elige KoHetal  Patel   On: 04/06/2014 21:19     EKG Interpretation None      MDM   Final diagnoses:  Impacted esophageal foreign body    Patient's symptoms have resolved. He is encouraged to followup with gastroenterology as an outpatient. Return precautions given.    Loren Raceravid Yelverton, MD 04/08/14 [phone]

## 2014-04-15 ENCOUNTER — Ambulatory Visit (HOSPITAL_COMMUNITY): Payer: Self-pay

## 2014-04-15 ENCOUNTER — Encounter (HOSPITAL_COMMUNITY): Payer: Self-pay

## 2014-04-17 ENCOUNTER — Encounter (HOSPITAL_COMMUNITY)
Admission: RE | Admit: 2014-04-17 | Discharge: 2014-04-17 | Disposition: A | Discharge status: Home / Self Care | Payer: Medicare Other | Source: Ambulatory Visit | Attending: Cardiology | Admitting: Cardiology

## 2014-04-17 ENCOUNTER — Other Ambulatory Visit: Payer: Self-pay

## 2014-04-17 ENCOUNTER — Ambulatory Visit (HOSPITAL_COMMUNITY)
Admission: RE | Admit: 2014-04-17 | Discharge: 2014-04-17 | Disposition: A | Discharge status: Home / Self Care | Payer: Medicare Other | Source: Ambulatory Visit | Attending: Cardiology | Admitting: Cardiology

## 2014-04-17 DIAGNOSIS — R079 Chest pain, unspecified: Secondary | ICD-10-CM | POA: Insufficient documentation

## 2014-04-17 MED ORDER — TECHNETIUM TC 99M SESTAMIBI - CARDIOLITE: 30.0000 | Freq: Once | INTRAVENOUS | Status: DC | PRN

## 2014-04-17 MED ORDER — TECHNETIUM TC 99M SESTAMIBI GENERIC - CARDIOLITE
10.0000 | Freq: Once | INTRAVENOUS | Status: AC | PRN
  Administered 2014-04-17: 10 via INTRAVENOUS

## 2014-04-17 MED ORDER — REGADENOSON 0.4 MG/5ML IV SOLN
INTRAVENOUS | Status: AC
  Administered 2014-04-17: 0.4 mg
  Filled 2014-04-17: qty 5

## 2014-04-17 MED ORDER — TECHNETIUM TC 99M SESTAMIBI GENERIC - CARDIOLITE
30.0000 | Freq: Once | INTRAVENOUS | Status: AC | PRN
  Administered 2014-04-17: 30 via INTRAVENOUS

## 2014-07-27 ENCOUNTER — Emergency Department (HOSPITAL_COMMUNITY)
Admission: EM | Admit: 2014-07-27 | Discharge: 2014-07-27 | Disposition: A | Discharge status: Home / Self Care | Payer: Medicare Other | Attending: Emergency Medicine | Admitting: Emergency Medicine

## 2014-07-27 ENCOUNTER — Encounter (HOSPITAL_COMMUNITY): Payer: Self-pay | Admitting: Emergency Medicine

## 2014-07-27 ENCOUNTER — Emergency Department (HOSPITAL_COMMUNITY): Payer: Medicare Other

## 2014-07-27 ENCOUNTER — Telehealth: Payer: Self-pay | Admitting: Internal Medicine

## 2014-07-27 DIAGNOSIS — Z87891 Personal history of nicotine dependence: Secondary | ICD-10-CM | POA: Diagnosis not present

## 2014-07-27 DIAGNOSIS — E119 Type 2 diabetes mellitus without complications: Secondary | ICD-10-CM | POA: Insufficient documentation

## 2014-07-27 DIAGNOSIS — I209 Angina pectoris, unspecified: Secondary | ICD-10-CM | POA: Insufficient documentation

## 2014-07-27 DIAGNOSIS — Z9889 Other specified postprocedural states: Secondary | ICD-10-CM | POA: Diagnosis not present

## 2014-07-27 DIAGNOSIS — R10816 Epigastric abdominal tenderness: Secondary | ICD-10-CM | POA: Diagnosis present

## 2014-07-27 DIAGNOSIS — Z7982 Long term (current) use of aspirin: Secondary | ICD-10-CM | POA: Insufficient documentation

## 2014-07-27 DIAGNOSIS — I10 Essential (primary) hypertension: Secondary | ICD-10-CM | POA: Diagnosis not present

## 2014-07-27 DIAGNOSIS — K859 Acute pancreatitis, unspecified: Secondary | ICD-10-CM | POA: Diagnosis not present

## 2014-07-27 DIAGNOSIS — Z7902 Long term (current) use of antithrombotics/antiplatelets: Secondary | ICD-10-CM | POA: Insufficient documentation

## 2014-07-27 DIAGNOSIS — Z79899 Other long term (current) drug therapy: Secondary | ICD-10-CM | POA: Diagnosis not present

## 2014-07-27 DIAGNOSIS — E785 Hyperlipidemia, unspecified: Secondary | ICD-10-CM | POA: Insufficient documentation

## 2014-07-27 LAB — CBC WITH DIFFERENTIAL/PLATELET
Basophils Absolute: 0 10*3/uL (ref 0.0–0.1)
Basophils Relative: 0 % (ref 0–1)
Eosinophils Absolute: 0.4 10*3/uL (ref 0.0–0.7)
Eosinophils Relative: 3 % (ref 0–5)
HCT: 42.1 % (ref 39.0–52.0)
Hemoglobin: 13.7 g/dL (ref 13.0–17.0)
Lymphocytes Relative: 34 % (ref 12–46)
Lymphs Abs: 3.7 10*3/uL (ref 0.7–4.0)
MCH: 28.1 pg (ref 26.0–34.0)
MCHC: 32.5 g/dL (ref 30.0–36.0)
MCV: 86.4 fL (ref 78.0–100.0)
Monocytes Absolute: 0.6 10*3/uL (ref 0.1–1.0)
Monocytes Relative: 5 % (ref 3–12)
Neutro Abs: 6.4 10*3/uL (ref 1.7–7.7)
Neutrophils Relative %: 58 % (ref 43–77)
Platelets: 229 10*3/uL (ref 150–400)
RBC: 4.87 MIL/uL (ref 4.22–5.81)
RDW: 14.8 % (ref 11.5–15.5)
WBC: 11.1 10*3/uL — ABNORMAL HIGH (ref 4.0–10.5)

## 2014-07-27 LAB — COMPREHENSIVE METABOLIC PANEL
ALT: 14 U/L (ref 0–53)
AST: 16 U/L (ref 0–37)
Albumin: 3.7 g/dL (ref 3.5–5.2)
Alkaline Phosphatase: 89 U/L (ref 39–117)
Anion gap: 12 (ref 5–15)
BUN: 20 mg/dL (ref 6–23)
CO2: 25 mEq/L (ref 19–32)
Calcium: 9.3 mg/dL (ref 8.4–10.5)
Chloride: 101 mEq/L (ref 96–112)
Creatinine, Ser: 1.27 mg/dL (ref 0.50–1.35)
GFR calc Af Amer: 64 mL/min — ABNORMAL LOW (ref >90)
GFR calc non Af Amer: 56 mL/min — ABNORMAL LOW (ref >90)
Glucose, Bld: 114 mg/dL — ABNORMAL HIGH (ref 70–99)
Potassium: 4 mEq/L (ref 3.7–5.3)
Sodium: 138 mEq/L (ref 137–147)
Total Bilirubin: 0.2 mg/dL — ABNORMAL LOW (ref 0.3–1.2)
Total Protein: 7.9 g/dL (ref 6.0–8.3)

## 2014-07-27 LAB — URINE MICROSCOPIC-ADD ON

## 2014-07-27 LAB — URINALYSIS, ROUTINE W REFLEX MICROSCOPIC
Bilirubin Urine: NEGATIVE
Glucose, UA: NEGATIVE mg/dL
Hgb urine dipstick: NEGATIVE
Ketones, ur: NEGATIVE mg/dL
Nitrite: NEGATIVE
Protein, ur: NEGATIVE mg/dL
Specific Gravity, Urine: 1.017 (ref 1.005–1.030)
Urobilinogen, UA: 0.2 mg/dL (ref 0.0–1.0)
pH: 5.5 (ref 5.0–8.0)

## 2014-07-27 LAB — LIPASE, BLOOD: Lipase: >3000 U/L — ABNORMAL HIGH (ref 11–59)

## 2014-07-27 MED ORDER — ONDANSETRON HCL 4 MG PO TABS: 4.0000 mg | ORAL_TABLET | Freq: Four times a day (QID) | ORAL | Status: DC

## 2014-07-27 MED ORDER — OXYCODONE-ACETAMINOPHEN 5-325 MG PO TABS: 1.0000 | ORAL_TABLET | ORAL | Status: DC | PRN

## 2014-07-27 MED ORDER — HYDROMORPHONE HCL 1 MG/ML IJ SOLN
1.0000 mg | Freq: Once | INTRAMUSCULAR | Status: DC
  Filled 2014-07-27: qty 1

## 2014-07-27 MED ORDER — ONDANSETRON HCL 4 MG/2ML IJ SOLN
4.0000 mg | Freq: Once | INTRAMUSCULAR | Status: DC
  Filled 2014-07-27: qty 2

## 2014-07-27 MED ORDER — SODIUM CHLORIDE 0.9 % IV BOLUS (SEPSIS)
1000.0000 mL | Freq: Once | INTRAVENOUS | Status: AC
  Administered 2014-07-27: 1000 mL via INTRAVENOUS

## 2014-07-27 NOTE — Telephone Encounter (Signed)
Called about this man because of diagnosis of acute pancreatitis. US shows contracted gallbladder with dilated pancreatic duct in tail. Patient not too symptomatic, i.e. Symptoms may not require admission. LFT's NL  Advised he get a CT abd/pelvis with contrast

## 2014-07-27 NOTE — Telephone Encounter (Signed)
It looks like he may have left the ED instead of getting a CT We will contact him later today to arrange office f/u this week

## 2014-07-27 NOTE — Telephone Encounter (Signed)
With you or is APP ok?

## 2014-07-27 NOTE — Telephone Encounter (Signed)
Either is fine he is a [facility] patient but I can see him if possible and not APP

## 2014-07-27 NOTE — ED Notes (Addendum)
Pt reports upper abdominal pain since 5pm. Denies any other sx. Pain started after he ate dinner. Pt had stress test completed recently.

## 2014-07-27 NOTE — Discharge Instructions (Signed)

## 2014-07-27 NOTE — ED Notes (Signed)
Pt c/o abd cramps tonight.  No n v or diarrhea alert skin warm and dry

## 2014-07-27 NOTE — ED Provider Notes (Signed)
CSN: [id number]     Arrival date & time 07/27/14  0228 History   First MD Initiated Contact with Patient 07/27/14 0315     Chief Complaint  Patient presents with  . Abdominal Pain     (Consider location/radiation/quality/duration/timing/severity/associated sxs/prior Treatment) HPI  68yM with abdominal pain. Onset this evening around 1700. Describes the pain/discomfort in his epigastric region. Some mild pain straight through to his back. Since his been relatively constant since onset. Mild nausea. No vomiting. No history similar symptoms. Has not tried taking anything for her symptoms. No fevers or chills. No urinary complaints. No sick contacts. No previous abdominal surgeries. Denies ETOH.   Past Medical History  Diagnosis Date  . Anginal pain   . Hypertension   . Hyperlipidemia   . Diabetes mellitus without complication    Past Surgical History  Procedure Laterality Date  . Cardiac catheterization    . Rotator cuff repair Bilateral    Family History  Problem Relation Age of Onset  . Cancer - Prostate Father    History  Substance Use Topics  . Smoking status: Former Smoker    Types: Cigarettes  . Smokeless tobacco: Not on file  . Alcohol Use: No    Review of Systems  All systems reviewed and negative, other than as noted in HPI.   Allergies  Review of patient's allergies indicates no known allergies.  Home Medications   Prior to Admission medications   Medication Sig Start Date End Date Taking? Authorizing Provider  aspirin 81 MG tablet Take 81 mg by mouth daily.   Yes Historical Provider, MD  atorvastatin (LIPITOR) 80 MG tablet Take 80 mg by mouth daily.    Yes Historical Provider, MD  clopidogrel (PLAVIX) 75 MG tablet Take 37.5 mg by mouth daily. Pt takes 1/2 pill   Yes Historical Provider, MD  metFORMIN (GLUCOPHAGE-XR) 500 MG 24 hr tablet Take 500 mg by mouth daily with breakfast.   Yes Historical Provider, MD  metoprolol tartrate (LOPRESSOR) 25 MG tablet  Take 25 mg by mouth 2 (two) times daily.   Yes Historical Provider, MD  ramipril (ALTACE) 10 MG capsule Take 10 mg by mouth 2 (two) times daily.    Yes Historical Provider, MD   BP 127/64  Pulse 73  Temp(Src) 97.9 F (36.6 C) (Oral)  Resp 18  SpO2 95% Physical Exam  Nursing note and vitals reviewed. Constitutional: He appears well-developed and well-nourished. No distress.  HENT:  Head: Normocephalic and atraumatic.  Eyes: Conjunctivae are normal. Right eye exhibits no discharge. Left eye exhibits no discharge.  Neck: Neck supple.  Cardiovascular: Normal rate, regular rhythm and normal heart sounds.  Exam reveals no gallop and no friction rub.   No murmur heard. Pulmonary/Chest: Effort normal and breath sounds normal. No respiratory distress.  Abdominal: Soft. He exhibits no distension. There is tenderness.  Mild epigastric tenderness. No rebound or guarding. No distension.   Musculoskeletal: He exhibits no edema and no tenderness.  Neurological: He is alert.  Skin: Skin is warm and dry.  Psychiatric: He has a normal mood and affect. His behavior is normal. Thought content normal.    ED Course  Procedures (including critical care time) Labs Review Labs Reviewed  CBC WITH DIFFERENTIAL - Abnormal; Notable for the following:    WBC 11.1 (*)    All other components within normal limits  COMPREHENSIVE METABOLIC PANEL - Abnormal; Notable for the following:    Glucose, Bld 114 (*)    Total Bilirubin 0.2 (*)  GFR calc non Af Amer 56 (*)    GFR calc Af Amer 64 (*)    All other components within normal limits  LIPASE, BLOOD - Abnormal; Notable for the following:    Lipase >3000 (*)    All other components within normal limits  URINALYSIS, ROUTINE W REFLEX MICROSCOPIC - Abnormal; Notable for the following:    Leukocytes, UA SMALL (*)    All other components within normal limits  URINE MICROSCOPIC-ADD ON - Abnormal; Notable for the following:    Squamous Epithelial / LPF FEW (*)     All other components within normal limits    Imaging Review No results found.  [place] Abdomen Complete  07/27/2014   CLINICAL DATA:  Initial evaluation of midline abdominal pain, periumbilical pain since early this morning. Elevated white count and lipase.  EXAM: ULTRASOUND ABDOMEN COMPLETE  COMPARISON:  None.  FINDINGS: Gallbladder: Gallbladder was contracted with no definite stones present within the gallbladder lumen. Wall with secondary early thickened up to 6.9 mm. No free pericholecystic fluid. No sonographic Murphy sign elicited on exam.  Common bile duct: Diameter: 6.9 mm  Liver: No focal lesion identified. Within normal limits in parenchymal echogenicity.  IVC: No abnormality visualized.  Pancreas: The pancreatic parenchyma was mildly hypoechoic and edematous in appearance near the level of the pancreatic head. Distally, the pancreatic duct is mildly prominent measuring 3.8 mm.  Spleen: Size and appearance within normal limits.  Right Kidney: Length: 12.4 cm. Echogenicity within normal limits. No mass or hydronephrosis visualized.  Left Kidney: Length: 8.7 cm. Echogenicity within normal limits. The left kidney itself was located within the left lower quadrant pain with slightly malrotated.  Abdominal aorta: No aneurysm visualized.  Other findings: None.  IMPRESSION: 1. Question subtle hypoechoic echotexture of the pancreatic parenchyma at the pancreatic head, which may be related to acute pancreatitis in the setting of elevated lipase. The pancreatic duct is mildly prominent distally measuring up to 3.8 mm, which may be secondary to duct compression from the edematous pancreas. These findings could be further evaluated with cross-sectional imaging as clinically desired. 2. Contracted gallbladder with secondary wall thickening without sonographic evidence of acute cholecystitis. 3. No choledocholithiasis or extrahepatic biliary dilatation.   Electronically Signed   By: Rise MuBenjamin  McClintock M.D.   On:  07/27/2014 06:09    EKG Interpretation None      MDM   Final diagnoses:  Acute pancreatitis, unspecified pancreatitis type    68yM with abdominal pain and n/v. Lipase >3000. Normal LFTs. Denies ETOH use. US with mildly dilated pancreatic duct. Symptoms pretty minimal at this point. Only mild tenderness on exam. Discussed with GI. Recommending CT. Pt declining. 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 He has medical decision making capability. Previously seen by Dr Russella DarStark. Instructed to FU.     Raeford RazorStephen Inman Fettig, MD 08/04/14 [phone]

## 2014-07-27 NOTE — ED Notes (Signed)
i was going to start an iv but the pt did not want it

## 2014-07-27 NOTE — Telephone Encounter (Signed)
Left message for patient to call back  

## 2014-07-27 NOTE — ED Notes (Signed)
To ct

## 2014-07-28 NOTE — Telephone Encounter (Signed)
Left message for patient to call back  

## 2014-07-29 ENCOUNTER — Other Ambulatory Visit: Payer: Self-pay | Admitting: Gastroenterology

## 2014-07-29 DIAGNOSIS — K851 Biliary acute pancreatitis without necrosis or infection: Secondary | ICD-10-CM

## 2014-07-29 NOTE — Telephone Encounter (Signed)
I have left another message for the patient to call back if he is still having pain and to arrange follow up.  I will await a return call from the patient

## 2014-07-30 NOTE — Telephone Encounter (Signed)
Argree  Closing out phone note We have tried our best toarrange f/u

## 2014-08-07 ENCOUNTER — Ambulatory Visit
Admission: RE | Admit: 2014-08-07 | Discharge: 2014-08-07 | Disposition: A | Discharge status: Home / Self Care | Payer: Medicare Other | Source: Ambulatory Visit | Attending: Gastroenterology | Admitting: Gastroenterology

## 2014-08-07 DIAGNOSIS — K851 Biliary acute pancreatitis without necrosis or infection: Secondary | ICD-10-CM

## 2014-09-08 ENCOUNTER — Other Ambulatory Visit: Payer: Self-pay | Admitting: Gastroenterology

## 2014-09-28 ENCOUNTER — Encounter (HOSPITAL_COMMUNITY): Payer: Self-pay | Admitting: *Deleted

## 2014-10-09 ENCOUNTER — Encounter (HOSPITAL_COMMUNITY)
Admission: RE | Disposition: A | Discharge status: Home / Self Care | Payer: Self-pay | Source: Ambulatory Visit | Attending: Gastroenterology

## 2014-10-09 ENCOUNTER — Ambulatory Visit (HOSPITAL_COMMUNITY): Payer: Medicare Other | Admitting: Anesthesiology

## 2014-10-09 ENCOUNTER — Encounter (HOSPITAL_COMMUNITY): Payer: Self-pay | Admitting: *Deleted

## 2014-10-09 ENCOUNTER — Ambulatory Visit (HOSPITAL_COMMUNITY)
Admission: RE | Admit: 2014-10-09 | Discharge: 2014-10-09 | Disposition: A | Discharge status: Home / Self Care | Payer: Medicare Other | Source: Ambulatory Visit | Attending: Gastroenterology | Admitting: Gastroenterology

## 2014-10-09 DIAGNOSIS — Z794 Long term (current) use of insulin: Secondary | ICD-10-CM | POA: Diagnosis not present

## 2014-10-09 DIAGNOSIS — K859 Acute pancreatitis, unspecified: Secondary | ICD-10-CM | POA: Diagnosis present

## 2014-10-09 DIAGNOSIS — I10 Essential (primary) hypertension: Secondary | ICD-10-CM | POA: Diagnosis not present

## 2014-10-09 DIAGNOSIS — E785 Hyperlipidemia, unspecified: Secondary | ICD-10-CM | POA: Insufficient documentation

## 2014-10-09 DIAGNOSIS — M13841 Other specified arthritis, right hand: Secondary | ICD-10-CM | POA: Insufficient documentation

## 2014-10-09 DIAGNOSIS — M13842 Other specified arthritis, left hand: Secondary | ICD-10-CM | POA: Diagnosis not present

## 2014-10-09 DIAGNOSIS — E119 Type 2 diabetes mellitus without complications: Secondary | ICD-10-CM | POA: Diagnosis not present

## 2014-10-09 HISTORY — PX: EUS: SHX5427

## 2014-10-09 HISTORY — DX: Unspecified osteoarthritis, unspecified site: M19.90

## 2014-10-09 LAB — GLUCOSE, CAPILLARY: GLUCOSE-CAPILLARY: 119 mg/dL — AB (ref 70–99)

## 2014-10-09 SURGERY — UPPER ENDOSCOPIC ULTRASOUND (EUS) LINEAR
Anesthesia: Monitor Anesthesia Care

## 2014-10-09 MED ORDER — LACTATED RINGERS IV SOLN
INTRAVENOUS | Status: DC
  Administered 2014-10-09: 500 mL via INTRAVENOUS

## 2014-10-09 MED ORDER — PROPOFOL 10 MG/ML IV BOLUS
INTRAVENOUS | Status: AC
  Filled 2014-10-09: qty 20

## 2014-10-09 MED ORDER — MIDAZOLAM HCL 2 MG/2ML IJ SOLN
INTRAMUSCULAR | Status: AC
  Filled 2014-10-09: qty 2

## 2014-10-09 MED ORDER — PROPOFOL INFUSION 10 MG/ML OPTIME
INTRAVENOUS | Status: DC | PRN
  Administered 2014-10-09: 100 ug/kg/min via INTRAVENOUS

## 2014-10-09 MED ORDER — MIDAZOLAM HCL 5 MG/5ML IJ SOLN
INTRAMUSCULAR | Status: DC | PRN
  Administered 2014-10-09 (×2): 1 mg via INTRAVENOUS

## 2014-10-09 MED ORDER — SODIUM CHLORIDE 0.9 % IV SOLN
INTRAVENOUS | Status: DC
Start: 2014-10-09 — End: 2014-10-09

## 2014-10-09 MED ORDER — PROPOFOL 10 MG/ML IV BOLUS
INTRAVENOUS | Status: DC | PRN
  Administered 2014-10-09 (×2): 40 mg via INTRAVENOUS
  Administered 2014-10-09: 20 mg via INTRAVENOUS

## 2014-10-09 NOTE — Transfer of Care (Signed)
Immediate Anesthesia Transfer of Care Note  Patient: Logan Cisneros  Procedure(s) Performed: Procedure(s) (LRB): UPPER ENDOSCOPIC ULTRASOUND (EUS) LINEAR (N/A)  Patient Location: PACU  Anesthesia Type: MAC  Level of Consciousness: sedated, patient cooperative and responds to stimulation  Airway & Oxygen Therapy: Patient Spontanous Breathing and Patient connected to face mask oxgen  Post-op Assessment: Report given to PACU RN and Post -op Vital signs reviewed and stable  Post vital signs: Reviewed and stable  Complications: No apparent anesthesia complications

## 2014-10-09 NOTE — H&P (Signed)
  Logan Cisneros HPI: This is a 68 year old male with a recent history of acute pancreatitis.  No overt source was identified imaging.  Currently he is well.  The thought is that it may be secondary to a biliary source.  Past Medical History  Diagnosis Date  . Anginal pain   . Hypertension   . Hyperlipidemia     hx of  . Diabetes mellitus without complication   . Arthritis     both thumbs    Past Surgical History  Procedure Laterality Date  . Cardiac catheterization    . Rotator cuff repair Bilateral   . Stent x 3  years ago    Family History  Problem Relation Age of Onset  . Cancer - Prostate Father     Social History:  reports that he quit smoking about 39 years ago. His smoking use included Cigarettes. He has a 19.5 pack-year smoking history. He has never used smokeless tobacco. He reports that he uses illicit drugs (Marijuana). He reports that he does not drink alcohol.  Allergies: No Known Allergies  Medications: Scheduled: Continuous:  No results found for this or any previous visit (from the past 24 hour(s)).   No results found.  ROS:  As stated above in the HPI otherwise negative.  There were no vitals taken for this visit.    PE: Gen: NAD, Alert and Oriented HEENT:  [place]/AT, EOMI Neck: Supple, no LAD Lungs: CTA Bilaterally CV: RRR without M/G/R ABM: Soft, NTND, +BS Ext: No C/C/E  Assessment/Plan: 1) Recent history of acute pancreatitis.  Plan: 1) EUS.  [name] D 10/09/2014, 7:58 AM

## 2014-10-09 NOTE — Anesthesia Postprocedure Evaluation (Signed)
  Anesthesia Post-op Note  Patient: Logan Cisneros  Procedure(s) Performed: Procedure(s) (LRB): UPPER ENDOSCOPIC ULTRASOUND (EUS) LINEAR (N/A)  Patient Location: PACU  Anesthesia Type: MAC  Level of Consciousness: awake and alert   Airway and Oxygen Therapy: Patient Spontanous Breathing  Post-op Pain: mild  Post-op Assessment: Post-op Vital signs reviewed, Patient's Cardiovascular Status Stable, Respiratory Function Stable, Patent Airway and No signs of Nausea or vomiting  Last Vitals:  Filed Vitals:   10/09/14 1025  BP:   Pulse: 66  Temp:   Resp: 17    Post-op Vital Signs: stable   Complications: No apparent anesthesia complications

## 2014-10-09 NOTE — Op Note (Signed)
[facility] [address] [place], [id number]   UPPER ENDOSCOPIC ULTRASOUND PROCEDURE REPORT     EXAM DATE: 10/09/2014  PATIENT NAME:          Logan Cisneros, Logan M          MR#: [id number] BIRTHDATE:       Apr 25, 1946     VISIT #:     [id number] ATTENDING:     Jeani HawkingPatrick Marya Lowden, MD     STATUS:     outpatient ASSISTANT:      Kandice RobinsonsAwaka, Guillaume and Rebeca AlertVernon, Rebecca  REFERRING MD: ASA CLASS:        Class III  INDICATIONS:  The patient is a 68 yr old male here for a lower endoscopic ultrasound due to Acute pancreatitis. PROCEDURE PERFORMED:     Upper Endoscopic Ultrasound with FNA   MEDICATIONS:     Monitored anesthesia care  CONSENT: The patient understands the risks and benefits of the procedure and understands that these risks include, but are not limited to: sedation, allergic reaction, infection, perforation and/or bleeding. Alternative means of evaluation and treatment include, among others: physical exam, x-rays, and/or surgical intervention. The patient elects to proceed with this endoscopic procedure.  DESCRIPTION OF PROCEDURE: During intra-op preparation period all mechanical & medical equipment was checked for proper function. Hand hygiene and appropriate measures for infection prevention was taken. After the risks, benefits and alternatives of the procedure were thoroughly explained, Informed consent was verified, confirmed and timeout was successfully executed by the treatment team. The patient was then placed in the left, lateral, decubitus position and IV sedation was administered. Throughout the procedure, the patients blood pressure, pulse and oxygen saturations were monitored continuously. Under direct visualization, the    was introduced through the mouth and advanced to the descending duodenum.  Water was used as necessary to provide an acoustic interface. The pulse, BP, and O2 saturation were monitored and documented by the physician and  nursing staff throughout the procedure. Upon completion of the imaging, water was removed and the patient was then discharged to recovery in stable condition with the appropriate post procedure care.   FINDINGS: The pancreas exhibited some mild echogenic strands, which can be from the recent pancreatitis.  No evidence of any masses or cysts.  The PD measured 3 mm in the head and neck region.  The CBD was normal in caliber measuring 3-4 mm.  The CBD was negative for any evidence of stones or sludge.    ADVERSE EVENTS:     There were no complications IMPRESSIONS:     1) Mild echogenic stranding, which can be from the recent history of pancreaitis. 2) Normal gallbladder and CBD.  No evidence of stones.  RECOMMENDATIONS:     1) Follow up PRN.  ___________________________________ Jeani HawkingPatrick Bleu Minerd, MD eSigned:  Jeani HawkingPatrick Tekamah Shellhammer, MD 10/09/2014 10:08 AM   cc:

## 2014-10-09 NOTE — Anesthesia Preprocedure Evaluation (Addendum)
Anesthesia Evaluation  Patient identified by MRN, date of birth, ID band Patient awake    Reviewed: Allergy & Precautions, H&P , NPO status , Patient's Chart, lab work & pertinent test results, reviewed documented beta blocker date and time   Airway Mallampati: II  TM Distance: >3 FB Neck ROM: full    Dental no notable dental hx. (+) Teeth Intact, Dental Advisory Given   Pulmonary neg pulmonary ROS, former smoker,  breath sounds clear to auscultation  Pulmonary exam normal       Cardiovascular Exercise Tolerance: Good hypertension, Pt. on home beta blockers + CAD and + Cardiac Stents Rhythm:regular Rate:Normal     Neuro/Psych negative neurological ROS  negative psych ROS   GI/Hepatic negative GI ROS, Neg liver ROS,   Endo/Other  diabetes, Well Controlled, Type 2, Oral Hypoglycemic Agents  Renal/GU negative Renal ROS  negative genitourinary   Musculoskeletal   Abdominal   Peds  Hematology negative hematology ROS (+)   Anesthesia Other Findings   Reproductive/Obstetrics negative OB ROS                            Anesthesia Physical Anesthesia Plan  ASA: III  Anesthesia Plan: MAC   Post-op Pain Management:    Induction:   Airway Management Planned:   Additional Equipment:   Intra-op Plan:   Post-operative Plan:   Informed Consent: I have reviewed the patients History and Physical, chart, labs and discussed the procedure including the risks, benefits and alternatives for the proposed anesthesia with the patient or authorized representative who has indicated his/her understanding and acceptance.   Dental Advisory Given  Plan Discussed with: CRNA and Surgeon  Anesthesia Plan Comments:         Anesthesia Quick Evaluation

## 2014-10-09 NOTE — Discharge Instructions (Signed)
Esophagogastroduodenoscopy °Care After °Refer to this sheet in the next few weeks. These instructions provide you with information on caring for yourself after your procedure. Your caregiver may also give you more specific instructions. Your treatment has been planned according to current medical practices, but problems sometimes occur. Call your caregiver if you have any problems or questions after your procedure.  °HOME CARE INSTRUCTIONS °· Do not eat or drink anything until the numbing medicine (local anesthetic) has worn off and your gag reflex has returned. You will know that the local anesthetic has worn off when you can swallow comfortably. °· Do not drive for 12 hours after the procedure or as directed by your caregiver. °· Only take medicines as directed by your caregiver. °SEEK MEDICAL CARE IF:  °· You cannot stop coughing. °· You are not urinating at all or less than usual. °SEEK IMMEDIATE MEDICAL CARE IF: °· You have difficulty swallowing. °· You cannot eat or drink. °· You have worsening throat or chest pain. °· You have dizziness, lightheadedness, or you faint. °· You have nausea or vomiting. °· You have chills. °· You have a fever. °· You have severe abdominal pain. °· You have black, tarry, or bloody stools. °Document Released: 09/25/2012 Document Reviewed: 09/25/2012 °ExitCare® Patient Information ©2015 [employer]. This information is not intended to replace advice given to you by your health care provider. Make sure you discuss any questions you have with your health care provider. ° ° °Conscious Sedation, Adult, Care After °Refer to this sheet in the next few weeks. These instructions provide you with information on caring for yourself after your procedure. Your health care provider may also give you more specific instructions. Your treatment has been planned according to current medical practices, but problems sometimes occur. Call your health care provider if you have any problems or  questions after your procedure. °WHAT TO EXPECT AFTER THE PROCEDURE  °After your procedure: °· You may feel sleepy, clumsy, and have poor balance for several hours. °· Vomiting may occur if you eat too soon after the procedure. °HOME CARE INSTRUCTIONS °· Do not participate in any activities where you could become injured for at least 24 hours. Do not: °¨ Drive. °¨ Swim. °¨ Ride a bicycle. °¨ Operate heavy machinery. °¨ Cook. °¨ Use power tools. °¨ Climb ladders. °¨ Work from a high place. °· Do not make important decisions or sign legal documents until you are improved. °· If you vomit, drink water, juice, or soup when you can drink without vomiting. Make sure you have little or no nausea before eating solid foods. °· Only take over-the-counter or prescription medicines for pain, discomfort, or fever as directed by your health care provider. °· Make sure you and your family fully understand everything about the medicines given to you, including what side effects may occur. °· You should not drink alcohol, take sleeping pills, or take medicines that cause drowsiness for at least 24 hours. °· If you smoke, do not smoke without supervision. °· If you are feeling better, you may resume normal activities 24 hours after you were sedated. °· Keep all appointments with your health care provider. °SEEK MEDICAL CARE IF: °· Your skin is pale or bluish in color. °· You continue to feel nauseous or vomit. °· Your pain is getting worse and is not helped by medicine. °· You have bleeding or swelling. °· You are still sleepy or feeling clumsy after 24 hours. °SEEK IMMEDIATE MEDICAL CARE IF: °· You develop a   rash. °· You have difficulty breathing. °· You develop any type of allergic problem. °· You have a fever. °MAKE SURE YOU: °· Understand these instructions. °· Will watch your condition. °· Will get help right away if you are not doing well or get worse. °Document Released: 07/30/2013 Document Reviewed: 07/30/2013 °ExitCare®  Patient Information ©2015 [employer]. This information is not intended to replace advice given to you by your health care provider. Make sure you discuss any questions you have with your health care provider. ° °

## 2014-10-12 ENCOUNTER — Encounter (HOSPITAL_COMMUNITY): Payer: Self-pay | Admitting: Gastroenterology

## 2014-12-31 ENCOUNTER — Encounter: Payer: Self-pay | Admitting: Podiatry

## 2014-12-31 ENCOUNTER — Ambulatory Visit (INDEPENDENT_AMBULATORY_CARE_PROVIDER_SITE_OTHER): Payer: Medicare Other | Admitting: Podiatry

## 2014-12-31 VITALS — BP 160/77 | HR 67 | Resp 16

## 2014-12-31 DIAGNOSIS — L988 Other specified disorders of the skin and subcutaneous tissue: Secondary | ICD-10-CM | POA: Diagnosis not present

## 2014-12-31 DIAGNOSIS — L84 Corns and callosities: Secondary | ICD-10-CM | POA: Diagnosis not present

## 2014-12-31 NOTE — Patient Instructions (Signed)
vaseline with saran wrap and white sock 2 nights a week

## 2015-01-01 NOTE — Progress Notes (Signed)
Subjective:     Patient ID: Logan RiegerDaniel M Hone, male   DOB: [birth year], 69 y.o.   MRN: [id number]  HPI patient presents with a crack in the plantar proximal portion of the right heel with no drainage noted and states it does become somewhat tender. Also complains of dry skin   Review of Systems     Objective:   Physical Exam Neurovascular status intact with muscle strength adequate and no other health history changes of significance noted patient's found to have keratotic tissue on the plantar proximal portion of the right heel that's localized in nature with some thickness noted and overall dry type skin formation    Assessment:     Fisher of the right heel with moderate discomfort and dry skin    Plan:     Reviewed condition and using sharp sterile instrumentation debrided the area with no drainage noted. Advised on Vaseline to be done twice a week before bed with Saran wrap and socks and then to use healthy foot type cream to try to keep the dry skin under control. Reappoint to recheck

## 2015-02-23 ENCOUNTER — Encounter: Payer: Self-pay | Admitting: Podiatry

## 2015-02-23 ENCOUNTER — Ambulatory Visit (INDEPENDENT_AMBULATORY_CARE_PROVIDER_SITE_OTHER): Payer: Medicare Other | Admitting: Podiatry

## 2015-02-23 ENCOUNTER — Ambulatory Visit (INDEPENDENT_AMBULATORY_CARE_PROVIDER_SITE_OTHER): Payer: Medicare Other

## 2015-02-23 VITALS — BP 145/67 | HR 83 | Resp 18

## 2015-02-23 DIAGNOSIS — M779 Enthesopathy, unspecified: Secondary | ICD-10-CM

## 2015-02-23 DIAGNOSIS — R609 Edema, unspecified: Secondary | ICD-10-CM

## 2015-02-23 DIAGNOSIS — R52 Pain, unspecified: Secondary | ICD-10-CM

## 2015-02-23 DIAGNOSIS — M1000 Idiopathic gout, unspecified site: Secondary | ICD-10-CM | POA: Diagnosis not present

## 2015-02-23 MED ORDER — TRIAMCINOLONE ACETONIDE 10 MG/ML IJ SUSP
10.0000 mg | Freq: Once | INTRAMUSCULAR | Status: AC
  Administered 2015-02-23: 10 mg

## 2015-02-23 NOTE — Progress Notes (Signed)
Subjective:     Patient ID: Logan Cisneros, male   DOB: 04-09-1946, 69 y.o.   MRN: [id number]  HPI patient states I'm getting pain in my left forefoot around the second joint and my right big toe joint. Also complains of a lot of swelling in the left forefoot and into the ankle   Review of Systems     Objective:   Physical Exam Neurovascular status is found to be intact with negative Homans sign noted and patient noted to have quite a bit of discomfort around the second metatarsophalangeal joint and around the big toe joint right with pain and fluid buildup noted. There is also pain into the midfoot left with edema present and into the ankle left with edema present    Assessment:     What appears to be a problem of probable gout with inflammatory changes and edema in the left forefoot and ankle    Plan:     H&P and all conditions discussed. Injected the left second MPJ 3 mg Dexon some Kenalog 5 mill grams Xylocaine and the right first MPJ 3 mg Kenalog 5 mg Xylocaine. [employer] boot and Ace wrap left and instructed on elevation reduced activity and leaving it on for 3-4 days and less it bothers him when he will take it off immediately. Reappoint if symptoms indicate

## 2015-04-19 ENCOUNTER — Other Ambulatory Visit: Payer: Self-pay

## 2015-12-15 DIAGNOSIS — M1990 Unspecified osteoarthritis, unspecified site: Secondary | ICD-10-CM | POA: Diagnosis not present

## 2015-12-15 DIAGNOSIS — E669 Obesity, unspecified: Secondary | ICD-10-CM | POA: Diagnosis not present

## 2015-12-15 DIAGNOSIS — E119 Type 2 diabetes mellitus without complications: Secondary | ICD-10-CM | POA: Diagnosis not present

## 2015-12-15 DIAGNOSIS — I2510 Atherosclerotic heart disease of native coronary artery without angina pectoris: Secondary | ICD-10-CM | POA: Diagnosis not present

## 2015-12-15 DIAGNOSIS — E785 Hyperlipidemia, unspecified: Secondary | ICD-10-CM | POA: Diagnosis not present

## 2015-12-15 DIAGNOSIS — I10 Essential (primary) hypertension: Secondary | ICD-10-CM | POA: Diagnosis not present

## 2015-12-15 DIAGNOSIS — R0609 Other forms of dyspnea: Secondary | ICD-10-CM | POA: Diagnosis not present

## 2015-12-15 DIAGNOSIS — M1000 Idiopathic gout, unspecified site: Secondary | ICD-10-CM | POA: Diagnosis not present

## 2015-12-15 DIAGNOSIS — R7309 Other abnormal glucose: Secondary | ICD-10-CM | POA: Diagnosis not present

## 2016-02-17 DIAGNOSIS — H2513 Age-related nuclear cataract, bilateral: Secondary | ICD-10-CM | POA: Diagnosis not present

## 2016-02-17 DIAGNOSIS — H25013 Cortical age-related cataract, bilateral: Secondary | ICD-10-CM | POA: Diagnosis not present

## 2016-02-17 DIAGNOSIS — H18413 Arcus senilis, bilateral: Secondary | ICD-10-CM | POA: Diagnosis not present

## 2016-02-17 DIAGNOSIS — H11153 Pinguecula, bilateral: Secondary | ICD-10-CM | POA: Diagnosis not present

## 2016-02-17 DIAGNOSIS — H40023 Open angle with borderline findings, high risk, bilateral: Secondary | ICD-10-CM | POA: Diagnosis not present

## 2016-03-09 ENCOUNTER — Encounter: Payer: Self-pay | Admitting: Podiatry

## 2016-03-09 ENCOUNTER — Ambulatory Visit: Payer: Self-pay

## 2016-03-09 ENCOUNTER — Ambulatory Visit (INDEPENDENT_AMBULATORY_CARE_PROVIDER_SITE_OTHER): Payer: Medicare Other

## 2016-03-09 ENCOUNTER — Ambulatory Visit (INDEPENDENT_AMBULATORY_CARE_PROVIDER_SITE_OTHER): Payer: Medicare Other | Admitting: Podiatry

## 2016-03-09 VITALS — BP 151/78 | HR 77 | Resp 16

## 2016-03-09 DIAGNOSIS — M1000 Idiopathic gout, unspecified site: Secondary | ICD-10-CM

## 2016-03-09 DIAGNOSIS — M779 Enthesopathy, unspecified: Secondary | ICD-10-CM | POA: Diagnosis not present

## 2016-03-09 MED ORDER — TRIAMCINOLONE ACETONIDE 10 MG/ML IJ SUSP
10.0000 mg | Freq: Once | INTRAMUSCULAR | Status: AC
  Administered 2016-03-09: 10 mg

## 2016-03-09 NOTE — Patient Instructions (Signed)

## 2016-03-09 NOTE — Progress Notes (Signed)
Subjective:     Patient ID: Logan Cisneros, male   DOB: 10-Oct-1946, 70 y.o.   MRN: [id number]  HPI patient states I'm getting pain in both my feet but the left one bothers me more and I feel like it's a gout attack again. I'm also worried about the possibility for broken bone due to the pain or other bone pathology which may have occurred   Review of Systems     Objective:   Physical Exam Neurovascular status intact muscle strength adequate with patient found to have inflammation and pain around the second metatarsal left that's localized in nature. The right shows mild pain but not as significant as the left and there is inflammation noted around this area    Assessment:     Probable capsulitis second MPJ left with possibility for gout attack occurring left over right    Plan:     H&P and x-rays reviewed with patient. Educated on gout and foods to be careful with and today injected around the second MPJ left 3 mg Kenalog 5 mill grams Xylocaine and reappoint to recheck  X-ray report indicates that there is no indications of arthritis stress fracture or advanced disease

## 2016-03-13 DIAGNOSIS — R58 Hemorrhage, not elsewhere classified: Secondary | ICD-10-CM | POA: Diagnosis not present

## 2016-03-15 DIAGNOSIS — I2510 Atherosclerotic heart disease of native coronary artery without angina pectoris: Secondary | ICD-10-CM | POA: Diagnosis not present

## 2016-03-15 DIAGNOSIS — E669 Obesity, unspecified: Secondary | ICD-10-CM | POA: Diagnosis not present

## 2016-03-15 DIAGNOSIS — E119 Type 2 diabetes mellitus without complications: Secondary | ICD-10-CM | POA: Diagnosis not present

## 2016-03-15 DIAGNOSIS — R7309 Other abnormal glucose: Secondary | ICD-10-CM | POA: Diagnosis not present

## 2016-03-15 DIAGNOSIS — M1990 Unspecified osteoarthritis, unspecified site: Secondary | ICD-10-CM | POA: Diagnosis not present

## 2016-03-15 DIAGNOSIS — M1000 Idiopathic gout, unspecified site: Secondary | ICD-10-CM | POA: Diagnosis not present

## 2016-03-15 DIAGNOSIS — R0609 Other forms of dyspnea: Secondary | ICD-10-CM | POA: Diagnosis not present

## 2016-03-15 DIAGNOSIS — I10 Essential (primary) hypertension: Secondary | ICD-10-CM | POA: Diagnosis not present

## 2016-03-15 DIAGNOSIS — E785 Hyperlipidemia, unspecified: Secondary | ICD-10-CM | POA: Diagnosis not present

## 2016-03-23 DIAGNOSIS — E119 Type 2 diabetes mellitus without complications: Secondary | ICD-10-CM | POA: Diagnosis not present

## 2016-03-23 DIAGNOSIS — E785 Hyperlipidemia, unspecified: Secondary | ICD-10-CM | POA: Diagnosis not present

## 2016-03-23 DIAGNOSIS — R0609 Other forms of dyspnea: Secondary | ICD-10-CM | POA: Diagnosis not present

## 2016-03-23 DIAGNOSIS — I2510 Atherosclerotic heart disease of native coronary artery without angina pectoris: Secondary | ICD-10-CM | POA: Diagnosis not present

## 2016-03-23 DIAGNOSIS — I10 Essential (primary) hypertension: Secondary | ICD-10-CM | POA: Diagnosis not present

## 2016-04-06 IMAGING — NM NM MYOCAR MULTI W/SPECT W/WALL MOTION & EF
1 series · 6 of 6 positions shown · non-contrast
Comparison: 08/11/2008.

CLINICAL DATA: Chest pain.

EXAM:
MYOCARDIAL IMAGING WITH SPECT (REST AND PHARMACOLOGIC-STRESS)
GATED LEFT VENTRICULAR WALL MOTION STUDY
LEFT VENTRICULAR EJECTION FRACTION
TECHNIQUE: Standard myocardial SPECT imaging was performed after resting
intravenous injection of 10 mCi Yc-77m sestamibi. Subsequently,
intravenous infusion of Lexiscan was performed under the supervision
of the Cardiology staff. At peak effect of the drug, 30 mCi Yc-77m
sestamibi was injected intravenously and standard myocardial SPECT
imaging was performed. Quantitative gated imaging was also performed
to evaluate left ventricular wall motion, and estimate left
ventricular ejection fraction.

[Series 2: stress gated · 8.28mm/px · 6 of 512 frames shown]
[frame 43/512]
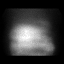
[frame 128/512]
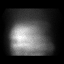
[frame 214/512]
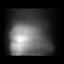
[frame 299/512]
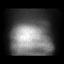
[frame 384/512]
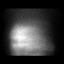
[frame 470/512]
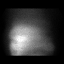

[6 of 6 positions shown; findings below may reference images not displayed]

FINDINGS: Diaphragmatic attenuation is present over the inferior wall. No
fixed or reversible defects are identified. Ejection fraction is not
improved compared to the prior exam, now 61%. End systolic volume is
37 mL. End-diastolic volume is 96 mL. Stroke volume similar at 58
mL.
IMPRESSION: No fixed or reversible ischemia.  Ejection fraction of 61%.

## 2016-05-02 DIAGNOSIS — Z125 Encounter for screening for malignant neoplasm of prostate: Secondary | ICD-10-CM | POA: Diagnosis not present

## 2016-05-02 DIAGNOSIS — I10 Essential (primary) hypertension: Secondary | ICD-10-CM | POA: Diagnosis not present

## 2016-05-02 DIAGNOSIS — E119 Type 2 diabetes mellitus without complications: Secondary | ICD-10-CM | POA: Diagnosis not present

## 2016-05-08 DIAGNOSIS — Z0000 Encounter for general adult medical examination without abnormal findings: Secondary | ICD-10-CM | POA: Diagnosis not present

## 2016-06-14 DIAGNOSIS — E785 Hyperlipidemia, unspecified: Secondary | ICD-10-CM | POA: Diagnosis not present

## 2016-06-14 DIAGNOSIS — I25118 Atherosclerotic heart disease of native coronary artery with other forms of angina pectoris: Secondary | ICD-10-CM | POA: Diagnosis not present

## 2016-06-14 DIAGNOSIS — M1990 Unspecified osteoarthritis, unspecified site: Secondary | ICD-10-CM | POA: Diagnosis not present

## 2016-06-14 DIAGNOSIS — M1000 Idiopathic gout, unspecified site: Secondary | ICD-10-CM | POA: Diagnosis not present

## 2016-06-14 DIAGNOSIS — I10 Essential (primary) hypertension: Secondary | ICD-10-CM | POA: Diagnosis not present

## 2016-06-14 DIAGNOSIS — E669 Obesity, unspecified: Secondary | ICD-10-CM | POA: Diagnosis not present

## 2016-06-14 DIAGNOSIS — E119 Type 2 diabetes mellitus without complications: Secondary | ICD-10-CM | POA: Diagnosis not present

## 2016-06-28 DIAGNOSIS — E119 Type 2 diabetes mellitus without complications: Secondary | ICD-10-CM | POA: Diagnosis not present

## 2016-06-28 DIAGNOSIS — I10 Essential (primary) hypertension: Secondary | ICD-10-CM | POA: Diagnosis not present

## 2016-06-28 DIAGNOSIS — Z23 Encounter for immunization: Secondary | ICD-10-CM | POA: Diagnosis not present

## 2016-06-28 DIAGNOSIS — E7800 Pure hypercholesterolemia, unspecified: Secondary | ICD-10-CM | POA: Diagnosis not present

## 2016-06-28 DIAGNOSIS — Z0000 Encounter for general adult medical examination without abnormal findings: Secondary | ICD-10-CM | POA: Diagnosis not present

## 2016-08-02 ENCOUNTER — Telehealth: Payer: Self-pay | Admitting: *Deleted

## 2016-08-02 ENCOUNTER — Ambulatory Visit (INDEPENDENT_AMBULATORY_CARE_PROVIDER_SITE_OTHER): Payer: Medicare Other

## 2016-08-02 ENCOUNTER — Ambulatory Visit (INDEPENDENT_AMBULATORY_CARE_PROVIDER_SITE_OTHER): Payer: Medicare Other | Admitting: Podiatry

## 2016-08-02 DIAGNOSIS — M84375A Stress fracture, left foot, initial encounter for fracture: Secondary | ICD-10-CM

## 2016-08-02 DIAGNOSIS — R52 Pain, unspecified: Secondary | ICD-10-CM

## 2016-08-02 NOTE — Telephone Encounter (Signed)
Pt states he saw Dr.Regal today and was given a boot, does he have to remain in the boot until his appt in 08/2016, and can he wear a regular sock. I informed pt that he needed to remain in the boot until his next appt, and he could wear regular sock. Pt states understanding.

## 2016-08-02 NOTE — Progress Notes (Signed)
Subjective:     Patient ID: Logan Cisneros, male   DOB: 1946/08/31, 70 y.o.   MRN: [id number]  HPI patient presents stating I developed a lesion and pain of my left forefoot with swelling and I don't know what I did but I have been trying to be active   Review of Systems     Objective:   Physical Exam Neurovascular status intact negative Homans sign noted with exquisite discomfort plantar aspect of the metatarsal cuneiform joints and into the midtarsal joint left with most of it centered around the second cuneiform    Assessment:     Possibility for stress fracture of this versus arthritis or tendinitis condition    Plan:     H&P x-rays reviewed and applied Ace wrap with surgical shoe and advised on reduced activity. Reappoint 4 weeks and we will decide if anything else is necessary and earlier if anything should occur  X-ray report indicates the possibility for fracture of the metatarsocuneiform joint which is difficult to make complete decision of at this point

## 2016-08-16 DIAGNOSIS — R52 Pain, unspecified: Secondary | ICD-10-CM

## 2016-08-18 DIAGNOSIS — M79672 Pain in left foot: Secondary | ICD-10-CM | POA: Diagnosis not present

## 2016-08-18 DIAGNOSIS — M25572 Pain in left ankle and joints of left foot: Secondary | ICD-10-CM | POA: Diagnosis not present

## 2016-08-21 DIAGNOSIS — M79672 Pain in left foot: Secondary | ICD-10-CM | POA: Diagnosis not present

## 2016-08-28 ENCOUNTER — Ambulatory Visit: Payer: Medicare Other | Admitting: Podiatry

## 2016-08-29 DIAGNOSIS — M79672 Pain in left foot: Secondary | ICD-10-CM | POA: Diagnosis not present

## 2016-09-27 DIAGNOSIS — M1990 Unspecified osteoarthritis, unspecified site: Secondary | ICD-10-CM | POA: Diagnosis not present

## 2016-09-27 DIAGNOSIS — E119 Type 2 diabetes mellitus without complications: Secondary | ICD-10-CM | POA: Diagnosis not present

## 2016-09-27 DIAGNOSIS — I10 Essential (primary) hypertension: Secondary | ICD-10-CM | POA: Diagnosis not present

## 2016-09-27 DIAGNOSIS — E039 Hypothyroidism, unspecified: Secondary | ICD-10-CM | POA: Diagnosis not present

## 2016-09-27 DIAGNOSIS — I25118 Atherosclerotic heart disease of native coronary artery with other forms of angina pectoris: Secondary | ICD-10-CM | POA: Diagnosis not present

## 2016-09-27 DIAGNOSIS — E669 Obesity, unspecified: Secondary | ICD-10-CM | POA: Diagnosis not present

## 2016-09-27 DIAGNOSIS — E785 Hyperlipidemia, unspecified: Secondary | ICD-10-CM | POA: Diagnosis not present

## 2016-10-18 DIAGNOSIS — I10 Essential (primary) hypertension: Secondary | ICD-10-CM | POA: Diagnosis not present

## 2016-10-18 DIAGNOSIS — E119 Type 2 diabetes mellitus without complications: Secondary | ICD-10-CM | POA: Diagnosis not present

## 2016-10-18 DIAGNOSIS — E7800 Pure hypercholesterolemia, unspecified: Secondary | ICD-10-CM | POA: Diagnosis not present

## 2016-10-25 DIAGNOSIS — M1A09X0 Idiopathic chronic gout, multiple sites, without tophus (tophi): Secondary | ICD-10-CM | POA: Diagnosis not present

## 2016-10-25 DIAGNOSIS — I10 Essential (primary) hypertension: Secondary | ICD-10-CM | POA: Diagnosis not present

## 2016-11-07 DIAGNOSIS — M109 Gout, unspecified: Secondary | ICD-10-CM | POA: Diagnosis not present

## 2016-11-07 DIAGNOSIS — M25572 Pain in left ankle and joints of left foot: Secondary | ICD-10-CM | POA: Diagnosis not present

## 2016-11-07 DIAGNOSIS — R21 Rash and other nonspecific skin eruption: Secondary | ICD-10-CM | POA: Diagnosis not present

## 2016-11-07 DIAGNOSIS — M79643 Pain in unspecified hand: Secondary | ICD-10-CM | POA: Diagnosis not present

## 2016-11-16 DIAGNOSIS — I8312 Varicose veins of left lower extremity with inflammation: Secondary | ICD-10-CM | POA: Diagnosis not present

## 2016-11-16 DIAGNOSIS — I872 Venous insufficiency (chronic) (peripheral): Secondary | ICD-10-CM | POA: Diagnosis not present

## 2016-11-16 DIAGNOSIS — L2089 Other atopic dermatitis: Secondary | ICD-10-CM | POA: Diagnosis not present

## 2016-11-16 DIAGNOSIS — I8311 Varicose veins of right lower extremity with inflammation: Secondary | ICD-10-CM | POA: Diagnosis not present

## 2016-12-08 DIAGNOSIS — M1A09X0 Idiopathic chronic gout, multiple sites, without tophus (tophi): Secondary | ICD-10-CM | POA: Diagnosis not present

## 2016-12-08 DIAGNOSIS — I10 Essential (primary) hypertension: Secondary | ICD-10-CM | POA: Diagnosis not present

## 2016-12-11 DIAGNOSIS — R21 Rash and other nonspecific skin eruption: Secondary | ICD-10-CM | POA: Diagnosis not present

## 2016-12-11 DIAGNOSIS — M25572 Pain in left ankle and joints of left foot: Secondary | ICD-10-CM | POA: Diagnosis not present

## 2016-12-11 DIAGNOSIS — M109 Gout, unspecified: Secondary | ICD-10-CM | POA: Diagnosis not present

## 2016-12-11 DIAGNOSIS — M79643 Pain in unspecified hand: Secondary | ICD-10-CM | POA: Diagnosis not present

## 2016-12-28 DIAGNOSIS — E785 Hyperlipidemia, unspecified: Secondary | ICD-10-CM | POA: Diagnosis not present

## 2016-12-28 DIAGNOSIS — I25118 Atherosclerotic heart disease of native coronary artery with other forms of angina pectoris: Secondary | ICD-10-CM | POA: Diagnosis not present

## 2016-12-28 DIAGNOSIS — E119 Type 2 diabetes mellitus without complications: Secondary | ICD-10-CM | POA: Diagnosis not present

## 2016-12-28 DIAGNOSIS — I10 Essential (primary) hypertension: Secondary | ICD-10-CM | POA: Diagnosis not present

## 2016-12-29 DIAGNOSIS — E119 Type 2 diabetes mellitus without complications: Secondary | ICD-10-CM | POA: Diagnosis not present

## 2016-12-29 DIAGNOSIS — E039 Hypothyroidism, unspecified: Secondary | ICD-10-CM | POA: Diagnosis not present

## 2016-12-29 DIAGNOSIS — I10 Essential (primary) hypertension: Secondary | ICD-10-CM | POA: Diagnosis not present

## 2016-12-29 DIAGNOSIS — E785 Hyperlipidemia, unspecified: Secondary | ICD-10-CM | POA: Diagnosis not present

## 2017-01-16 DIAGNOSIS — M79643 Pain in unspecified hand: Secondary | ICD-10-CM | POA: Diagnosis not present

## 2017-01-16 DIAGNOSIS — R21 Rash and other nonspecific skin eruption: Secondary | ICD-10-CM | POA: Diagnosis not present

## 2017-01-16 DIAGNOSIS — M109 Gout, unspecified: Secondary | ICD-10-CM | POA: Diagnosis not present

## 2017-02-01 DIAGNOSIS — I8311 Varicose veins of right lower extremity with inflammation: Secondary | ICD-10-CM | POA: Diagnosis not present

## 2017-02-01 DIAGNOSIS — I8312 Varicose veins of left lower extremity with inflammation: Secondary | ICD-10-CM | POA: Diagnosis not present

## 2017-02-01 DIAGNOSIS — I872 Venous insufficiency (chronic) (peripheral): Secondary | ICD-10-CM | POA: Diagnosis not present

## 2017-02-01 DIAGNOSIS — L301 Dyshidrosis [pompholyx]: Secondary | ICD-10-CM | POA: Diagnosis not present

## 2017-04-11 DIAGNOSIS — E785 Hyperlipidemia, unspecified: Secondary | ICD-10-CM | POA: Diagnosis not present

## 2017-04-11 DIAGNOSIS — E119 Type 2 diabetes mellitus without complications: Secondary | ICD-10-CM | POA: Diagnosis not present

## 2017-04-11 DIAGNOSIS — I10 Essential (primary) hypertension: Secondary | ICD-10-CM | POA: Diagnosis not present

## 2017-04-11 DIAGNOSIS — I25118 Atherosclerotic heart disease of native coronary artery with other forms of angina pectoris: Secondary | ICD-10-CM | POA: Diagnosis not present

## 2017-05-18 DIAGNOSIS — M79643 Pain in unspecified hand: Secondary | ICD-10-CM | POA: Diagnosis not present

## 2017-05-18 DIAGNOSIS — R21 Rash and other nonspecific skin eruption: Secondary | ICD-10-CM | POA: Diagnosis not present

## 2017-05-18 DIAGNOSIS — M109 Gout, unspecified: Secondary | ICD-10-CM | POA: Diagnosis not present

## 2017-07-02 DIAGNOSIS — I10 Essential (primary) hypertension: Secondary | ICD-10-CM | POA: Diagnosis not present

## 2017-07-02 DIAGNOSIS — E7800 Pure hypercholesterolemia, unspecified: Secondary | ICD-10-CM | POA: Diagnosis not present

## 2017-07-02 DIAGNOSIS — Z125 Encounter for screening for malignant neoplasm of prostate: Secondary | ICD-10-CM | POA: Diagnosis not present

## 2017-07-02 DIAGNOSIS — E119 Type 2 diabetes mellitus without complications: Secondary | ICD-10-CM | POA: Diagnosis not present

## 2017-07-02 DIAGNOSIS — Z23 Encounter for immunization: Secondary | ICD-10-CM | POA: Diagnosis not present

## 2017-07-02 DIAGNOSIS — Z0000 Encounter for general adult medical examination without abnormal findings: Secondary | ICD-10-CM | POA: Diagnosis not present

## 2017-07-04 DIAGNOSIS — I10 Essential (primary) hypertension: Secondary | ICD-10-CM | POA: Diagnosis not present

## 2017-07-04 DIAGNOSIS — Z1212 Encounter for screening for malignant neoplasm of rectum: Secondary | ICD-10-CM | POA: Diagnosis not present

## 2017-07-04 DIAGNOSIS — E119 Type 2 diabetes mellitus without complications: Secondary | ICD-10-CM | POA: Diagnosis not present

## 2017-07-04 DIAGNOSIS — Z23 Encounter for immunization: Secondary | ICD-10-CM | POA: Diagnosis not present

## 2017-07-04 DIAGNOSIS — Z0001 Encounter for general adult medical examination with abnormal findings: Secondary | ICD-10-CM | POA: Diagnosis not present

## 2017-07-11 DIAGNOSIS — E119 Type 2 diabetes mellitus without complications: Secondary | ICD-10-CM | POA: Diagnosis not present

## 2017-07-11 DIAGNOSIS — I10 Essential (primary) hypertension: Secondary | ICD-10-CM | POA: Diagnosis not present

## 2017-07-11 DIAGNOSIS — E785 Hyperlipidemia, unspecified: Secondary | ICD-10-CM | POA: Diagnosis not present

## 2017-07-11 DIAGNOSIS — I25118 Atherosclerotic heart disease of native coronary artery with other forms of angina pectoris: Secondary | ICD-10-CM | POA: Diagnosis not present

## 2017-10-05 DIAGNOSIS — H01024 Squamous blepharitis left upper eyelid: Secondary | ICD-10-CM | POA: Diagnosis not present

## 2017-10-05 DIAGNOSIS — H01022 Squamous blepharitis right lower eyelid: Secondary | ICD-10-CM | POA: Diagnosis not present

## 2017-10-05 DIAGNOSIS — H1711 Central corneal opacity, right eye: Secondary | ICD-10-CM | POA: Diagnosis not present

## 2017-10-05 DIAGNOSIS — H01021 Squamous blepharitis right upper eyelid: Secondary | ICD-10-CM | POA: Diagnosis not present

## 2017-10-09 DIAGNOSIS — H01022 Squamous blepharitis right lower eyelid: Secondary | ICD-10-CM | POA: Diagnosis not present

## 2017-10-09 DIAGNOSIS — H01024 Squamous blepharitis left upper eyelid: Secondary | ICD-10-CM | POA: Diagnosis not present

## 2017-10-09 DIAGNOSIS — H1711 Central corneal opacity, right eye: Secondary | ICD-10-CM | POA: Diagnosis not present

## 2017-10-09 DIAGNOSIS — H01021 Squamous blepharitis right upper eyelid: Secondary | ICD-10-CM | POA: Diagnosis not present

## 2017-10-12 DIAGNOSIS — E785 Hyperlipidemia, unspecified: Secondary | ICD-10-CM | POA: Diagnosis not present

## 2017-10-12 DIAGNOSIS — I10 Essential (primary) hypertension: Secondary | ICD-10-CM | POA: Diagnosis not present

## 2017-10-12 DIAGNOSIS — H1711 Central corneal opacity, right eye: Secondary | ICD-10-CM | POA: Diagnosis not present

## 2017-10-12 DIAGNOSIS — H01021 Squamous blepharitis right upper eyelid: Secondary | ICD-10-CM | POA: Diagnosis not present

## 2017-10-12 DIAGNOSIS — E119 Type 2 diabetes mellitus without complications: Secondary | ICD-10-CM | POA: Diagnosis not present

## 2017-10-12 DIAGNOSIS — H01022 Squamous blepharitis right lower eyelid: Secondary | ICD-10-CM | POA: Diagnosis not present

## 2017-10-12 DIAGNOSIS — H01024 Squamous blepharitis left upper eyelid: Secondary | ICD-10-CM | POA: Diagnosis not present

## 2017-11-02 DIAGNOSIS — M25561 Pain in right knee: Secondary | ICD-10-CM | POA: Diagnosis not present

## 2017-11-02 DIAGNOSIS — E119 Type 2 diabetes mellitus without complications: Secondary | ICD-10-CM | POA: Diagnosis not present

## 2017-11-02 DIAGNOSIS — R197 Diarrhea, unspecified: Secondary | ICD-10-CM | POA: Diagnosis not present

## 2017-11-16 DIAGNOSIS — M109 Gout, unspecified: Secondary | ICD-10-CM | POA: Diagnosis not present

## 2017-11-16 DIAGNOSIS — M25461 Effusion, right knee: Secondary | ICD-10-CM | POA: Diagnosis not present

## 2017-11-16 DIAGNOSIS — M12862 Other specific arthropathies, not elsewhere classified, left knee: Secondary | ICD-10-CM | POA: Diagnosis not present

## 2017-11-16 DIAGNOSIS — M25561 Pain in right knee: Secondary | ICD-10-CM | POA: Diagnosis not present

## 2017-11-16 DIAGNOSIS — M79643 Pain in unspecified hand: Secondary | ICD-10-CM | POA: Diagnosis not present

## 2018-02-18 DIAGNOSIS — M25461 Effusion, right knee: Secondary | ICD-10-CM | POA: Diagnosis not present

## 2018-02-18 DIAGNOSIS — M109 Gout, unspecified: Secondary | ICD-10-CM | POA: Diagnosis not present

## 2018-02-18 DIAGNOSIS — M25561 Pain in right knee: Secondary | ICD-10-CM | POA: Diagnosis not present

## 2018-02-18 DIAGNOSIS — M79643 Pain in unspecified hand: Secondary | ICD-10-CM | POA: Diagnosis not present

## 2018-03-01 DIAGNOSIS — N183 Chronic kidney disease, stage 3 (moderate): Secondary | ICD-10-CM | POA: Diagnosis not present

## 2018-03-01 DIAGNOSIS — E119 Type 2 diabetes mellitus without complications: Secondary | ICD-10-CM | POA: Diagnosis not present

## 2018-06-03 DIAGNOSIS — I25118 Atherosclerotic heart disease of native coronary artery with other forms of angina pectoris: Secondary | ICD-10-CM | POA: Diagnosis not present

## 2018-06-03 DIAGNOSIS — I10 Essential (primary) hypertension: Secondary | ICD-10-CM | POA: Diagnosis not present

## 2018-06-03 DIAGNOSIS — E119 Type 2 diabetes mellitus without complications: Secondary | ICD-10-CM | POA: Diagnosis not present

## 2018-06-03 DIAGNOSIS — E785 Hyperlipidemia, unspecified: Secondary | ICD-10-CM | POA: Diagnosis not present

## 2018-07-08 DIAGNOSIS — E119 Type 2 diabetes mellitus without complications: Secondary | ICD-10-CM | POA: Diagnosis not present

## 2018-07-08 DIAGNOSIS — I10 Essential (primary) hypertension: Secondary | ICD-10-CM | POA: Diagnosis not present

## 2018-07-08 DIAGNOSIS — E7800 Pure hypercholesterolemia, unspecified: Secondary | ICD-10-CM | POA: Diagnosis not present

## 2018-07-08 DIAGNOSIS — Z125 Encounter for screening for malignant neoplasm of prostate: Secondary | ICD-10-CM | POA: Diagnosis not present

## 2018-07-11 DIAGNOSIS — Z0000 Encounter for general adult medical examination without abnormal findings: Secondary | ICD-10-CM | POA: Diagnosis not present

## 2018-07-11 DIAGNOSIS — E119 Type 2 diabetes mellitus without complications: Secondary | ICD-10-CM | POA: Diagnosis not present

## 2018-07-11 DIAGNOSIS — E7800 Pure hypercholesterolemia, unspecified: Secondary | ICD-10-CM | POA: Diagnosis not present

## 2018-07-11 DIAGNOSIS — I10 Essential (primary) hypertension: Secondary | ICD-10-CM | POA: Diagnosis not present

## 2018-07-17 DIAGNOSIS — Z13820 Encounter for screening for osteoporosis: Secondary | ICD-10-CM | POA: Diagnosis not present

## 2018-09-02 DIAGNOSIS — M1990 Unspecified osteoarthritis, unspecified site: Secondary | ICD-10-CM | POA: Diagnosis not present

## 2018-09-02 DIAGNOSIS — M109 Gout, unspecified: Secondary | ICD-10-CM | POA: Diagnosis not present

## 2018-09-02 DIAGNOSIS — M79643 Pain in unspecified hand: Secondary | ICD-10-CM | POA: Diagnosis not present

## 2018-09-02 DIAGNOSIS — M25561 Pain in right knee: Secondary | ICD-10-CM | POA: Diagnosis not present

## 2018-09-02 DIAGNOSIS — M25461 Effusion, right knee: Secondary | ICD-10-CM | POA: Diagnosis not present

## 2018-09-18 DIAGNOSIS — E785 Hyperlipidemia, unspecified: Secondary | ICD-10-CM | POA: Diagnosis not present

## 2018-09-18 DIAGNOSIS — E039 Hypothyroidism, unspecified: Secondary | ICD-10-CM | POA: Diagnosis not present

## 2018-09-18 DIAGNOSIS — I10 Essential (primary) hypertension: Secondary | ICD-10-CM | POA: Diagnosis not present

## 2018-09-18 DIAGNOSIS — I25118 Atherosclerotic heart disease of native coronary artery with other forms of angina pectoris: Secondary | ICD-10-CM | POA: Diagnosis not present

## 2018-09-18 DIAGNOSIS — E119 Type 2 diabetes mellitus without complications: Secondary | ICD-10-CM | POA: Diagnosis not present

## 2018-11-26 DIAGNOSIS — E119 Type 2 diabetes mellitus without complications: Secondary | ICD-10-CM | POA: Diagnosis not present

## 2018-11-26 DIAGNOSIS — Z6835 Body mass index (BMI) 35.0-35.9, adult: Secondary | ICD-10-CM | POA: Diagnosis not present

## 2018-12-18 DIAGNOSIS — E785 Hyperlipidemia, unspecified: Secondary | ICD-10-CM | POA: Diagnosis not present

## 2018-12-18 DIAGNOSIS — E119 Type 2 diabetes mellitus without complications: Secondary | ICD-10-CM | POA: Diagnosis not present

## 2018-12-18 DIAGNOSIS — I10 Essential (primary) hypertension: Secondary | ICD-10-CM | POA: Diagnosis not present

## 2018-12-18 DIAGNOSIS — I25118 Atherosclerotic heart disease of native coronary artery with other forms of angina pectoris: Secondary | ICD-10-CM | POA: Diagnosis not present

## 2018-12-23 DIAGNOSIS — K222 Esophageal obstruction: Secondary | ICD-10-CM | POA: Diagnosis not present

## 2018-12-23 DIAGNOSIS — Z1211 Encounter for screening for malignant neoplasm of colon: Secondary | ICD-10-CM | POA: Diagnosis not present

## 2018-12-23 DIAGNOSIS — R1310 Dysphagia, unspecified: Secondary | ICD-10-CM | POA: Diagnosis not present

## 2018-12-26 DIAGNOSIS — I10 Essential (primary) hypertension: Secondary | ICD-10-CM | POA: Diagnosis not present

## 2018-12-26 DIAGNOSIS — E119 Type 2 diabetes mellitus without complications: Secondary | ICD-10-CM | POA: Diagnosis not present

## 2018-12-26 DIAGNOSIS — Z6835 Body mass index (BMI) 35.0-35.9, adult: Secondary | ICD-10-CM | POA: Diagnosis not present

## 2018-12-26 DIAGNOSIS — E7800 Pure hypercholesterolemia, unspecified: Secondary | ICD-10-CM | POA: Diagnosis not present

## 2019-03-18 DIAGNOSIS — E119 Type 2 diabetes mellitus without complications: Secondary | ICD-10-CM | POA: Diagnosis not present

## 2019-03-18 DIAGNOSIS — I10 Essential (primary) hypertension: Secondary | ICD-10-CM | POA: Diagnosis not present

## 2019-03-24 DIAGNOSIS — M79643 Pain in unspecified hand: Secondary | ICD-10-CM | POA: Diagnosis not present

## 2019-03-24 DIAGNOSIS — M1990 Unspecified osteoarthritis, unspecified site: Secondary | ICD-10-CM | POA: Diagnosis not present

## 2019-03-24 DIAGNOSIS — M25561 Pain in right knee: Secondary | ICD-10-CM | POA: Diagnosis not present

## 2019-03-24 DIAGNOSIS — M109 Gout, unspecified: Secondary | ICD-10-CM | POA: Diagnosis not present

## 2019-03-26 DIAGNOSIS — I10 Essential (primary) hypertension: Secondary | ICD-10-CM | POA: Diagnosis not present

## 2019-03-26 DIAGNOSIS — I25118 Atherosclerotic heart disease of native coronary artery with other forms of angina pectoris: Secondary | ICD-10-CM | POA: Diagnosis not present

## 2019-03-26 DIAGNOSIS — E785 Hyperlipidemia, unspecified: Secondary | ICD-10-CM | POA: Diagnosis not present

## 2019-03-26 DIAGNOSIS — E119 Type 2 diabetes mellitus without complications: Secondary | ICD-10-CM | POA: Diagnosis not present

## 2019-06-26 DIAGNOSIS — I10 Essential (primary) hypertension: Secondary | ICD-10-CM | POA: Diagnosis not present

## 2019-06-26 DIAGNOSIS — E119 Type 2 diabetes mellitus without complications: Secondary | ICD-10-CM | POA: Diagnosis not present

## 2019-06-26 DIAGNOSIS — I25118 Atherosclerotic heart disease of native coronary artery with other forms of angina pectoris: Secondary | ICD-10-CM | POA: Diagnosis not present

## 2019-06-26 DIAGNOSIS — E785 Hyperlipidemia, unspecified: Secondary | ICD-10-CM | POA: Diagnosis not present

## 2019-07-18 DIAGNOSIS — E875 Hyperkalemia: Secondary | ICD-10-CM | POA: Diagnosis not present

## 2019-07-18 DIAGNOSIS — E7800 Pure hypercholesterolemia, unspecified: Secondary | ICD-10-CM | POA: Diagnosis not present

## 2019-07-18 DIAGNOSIS — E119 Type 2 diabetes mellitus without complications: Secondary | ICD-10-CM | POA: Diagnosis not present

## 2019-07-18 DIAGNOSIS — Z0000 Encounter for general adult medical examination without abnormal findings: Secondary | ICD-10-CM | POA: Diagnosis not present

## 2019-07-18 DIAGNOSIS — Z125 Encounter for screening for malignant neoplasm of prostate: Secondary | ICD-10-CM | POA: Diagnosis not present

## 2019-07-21 DIAGNOSIS — I10 Essential (primary) hypertension: Secondary | ICD-10-CM | POA: Diagnosis not present

## 2019-07-21 DIAGNOSIS — E7800 Pure hypercholesterolemia, unspecified: Secondary | ICD-10-CM | POA: Diagnosis not present

## 2019-07-21 DIAGNOSIS — Z8042 Family history of malignant neoplasm of prostate: Secondary | ICD-10-CM | POA: Diagnosis not present

## 2019-07-21 DIAGNOSIS — E119 Type 2 diabetes mellitus without complications: Secondary | ICD-10-CM | POA: Diagnosis not present

## 2019-08-13 DIAGNOSIS — Z125 Encounter for screening for malignant neoplasm of prostate: Secondary | ICD-10-CM | POA: Diagnosis not present

## 2019-08-13 DIAGNOSIS — E119 Type 2 diabetes mellitus without complications: Secondary | ICD-10-CM | POA: Diagnosis not present

## 2019-08-20 DIAGNOSIS — E119 Type 2 diabetes mellitus without complications: Secondary | ICD-10-CM | POA: Diagnosis not present

## 2019-08-20 DIAGNOSIS — E7800 Pure hypercholesterolemia, unspecified: Secondary | ICD-10-CM | POA: Diagnosis not present

## 2019-08-20 DIAGNOSIS — I10 Essential (primary) hypertension: Secondary | ICD-10-CM | POA: Diagnosis not present

## 2019-08-20 DIAGNOSIS — Z0001 Encounter for general adult medical examination with abnormal findings: Secondary | ICD-10-CM | POA: Diagnosis not present

## 2019-09-02 DIAGNOSIS — Z1211 Encounter for screening for malignant neoplasm of colon: Secondary | ICD-10-CM | POA: Diagnosis not present

## 2019-09-02 DIAGNOSIS — R1310 Dysphagia, unspecified: Secondary | ICD-10-CM | POA: Diagnosis not present

## 2019-09-02 DIAGNOSIS — K222 Esophageal obstruction: Secondary | ICD-10-CM | POA: Diagnosis not present

## 2019-09-24 DIAGNOSIS — M1990 Unspecified osteoarthritis, unspecified site: Secondary | ICD-10-CM | POA: Diagnosis not present

## 2019-09-24 DIAGNOSIS — M79643 Pain in unspecified hand: Secondary | ICD-10-CM | POA: Diagnosis not present

## 2019-09-24 DIAGNOSIS — B351 Tinea unguium: Secondary | ICD-10-CM | POA: Diagnosis not present

## 2019-09-24 DIAGNOSIS — M25561 Pain in right knee: Secondary | ICD-10-CM | POA: Diagnosis not present

## 2019-09-24 DIAGNOSIS — M109 Gout, unspecified: Secondary | ICD-10-CM | POA: Diagnosis not present

## 2019-10-22 DIAGNOSIS — K222 Esophageal obstruction: Secondary | ICD-10-CM | POA: Diagnosis not present

## 2019-10-22 DIAGNOSIS — K200 Eosinophilic esophagitis: Secondary | ICD-10-CM | POA: Diagnosis not present

## 2019-10-30 ENCOUNTER — Ambulatory Visit: Payer: Medicare Other | Admitting: Internal Medicine

## 2019-10-30 ENCOUNTER — Other Ambulatory Visit: Payer: Self-pay

## 2019-10-30 ENCOUNTER — Encounter: Payer: Self-pay | Admitting: Internal Medicine

## 2019-10-30 VITALS — BP 124/77 | HR 65 | Temp 97.7°F | Ht 71.0 in | Wt 247.4 lb

## 2019-10-30 DIAGNOSIS — I2510 Atherosclerotic heart disease of native coronary artery without angina pectoris: Secondary | ICD-10-CM | POA: Diagnosis not present

## 2019-10-30 DIAGNOSIS — E782 Mixed hyperlipidemia: Secondary | ICD-10-CM | POA: Diagnosis not present

## 2019-10-30 DIAGNOSIS — I10 Essential (primary) hypertension: Secondary | ICD-10-CM | POA: Diagnosis not present

## 2019-10-30 NOTE — Patient Instructions (Signed)
Medication Instructions:  Continue current medications  *If you need a refill on your cardiac medications before your next appointment, please call your pharmacy*  Lab Work: None Ordered  Testing/Procedures: None Ordered  Follow-Up: At [facility], you and your health needs are our priority.  As part of our continuing mission to provide you with exceptional heart care, we have created designated Provider Care Teams.  These Care Teams include your primary Cardiologist (physician) and Advanced Practice Providers (APPs -  Physician Assistants and Nurse Practitioners) who all work together to provide you with the care you need, when you need it.  Your next appointment:   6 month(s)  The format for your next appointment:   In Person  Provider:   K. Chad Hilty, MD    

## 2019-10-30 NOTE — Progress Notes (Signed)
OFFICE CONSULT NOTE  Chief Complaint:  Establish cardiologist  Primary Care Physician: Deland Pretty, MD  HPI:  Logan Cisneros is a 74 y.o. male who is being seen today for the evaluation of establish cardiologist at the request of Deland Pretty, MD.  This is a pleasant 74 year old male was kindly referred by Dr. Concha Pyo to establish as a new patient with me.  He is wife is also a patient of mine.  He has been a longstanding patient of Dr. Terrence Dupont and apparently had 3 cardiac stents the last of which was placed in 1998.  He had had chest pain and unstable angina at least at the time and was symptomatic.  Since then he has done very well.  He is followed with Dr. Terrence Dupont is his primary care physician until recently and is seeing him every 3 months but generally has had no issues.  He also has type 2 diabetes which is well controlled and hemoglobin A1c of 6.2.  There is dyslipidemia with recent lipid testing that showed total cholesterol 134, triglycerides 110, HDL 42 and LDL of 72.  There is some mild chronic kidney disease with creatinine 1.4.  He also has hypertension which is well controlled and blood pressure 124/77 today.  He remains physically active.  There is a history of gout however is well controlled on allopurinol.  He is previously been tested for sleep apnea which was negative.  He denies any chest pain or shortness of breath.  He has been on longstanding dual antiplatelet therapy and denies any bleeding or issues with that.  PMHx:  Past Medical History:  Diagnosis Date  . Anginal pain ([place])   . Arthritis    both thumbs  . Diabetes mellitus without complication ([place])   . Hyperlipidemia    hx of  . Hypertension     Past Surgical History:  Procedure Laterality Date  . CARDIAC CATHETERIZATION    . EUS N/A 10/09/2014   Procedure: UPPER ENDOSCOPIC ULTRASOUND (EUS) LINEAR;  Surgeon: Beryle Beams, MD;  Location: [facility];  Service: Endoscopy;  Laterality: N/A;  . ROTATOR  CUFF REPAIR Bilateral   . stent x 3  years ago    FAMHx:  Family History  Problem Relation Age of Onset  . Cancer - Prostate Father     SOCHx:   reports that he quit smoking about 45 years ago. His smoking use included cigarettes. He has a 19.50 pack-year smoking history. He has never used smokeless tobacco. He reports current drug use. Drug: Marijuana. He reports that he does not drink alcohol.  ALLERGIES:  No Known Allergies  ROS: Pertinent items noted in HPI and remainder of comprehensive ROS otherwise negative.  HOME MEDS: Current Outpatient Medications on File Prior to Visit  Medication Sig Dispense Refill  . allopurinol (ZYLOPRIM) 100 MG tablet   2  . aspirin 81 MG tablet Take 81 mg by mouth daily.    Marland Kitchen atorvastatin (LIPITOR) 80 MG tablet Take 80 mg by mouth daily.    . clopidogrel (PLAVIX) 75 MG tablet Take 37.5 mg by mouth daily. Pt takes 1/2 pill    . irbesartan (AVAPRO) 300 MG tablet Take 300 mg by mouth daily.    . metFORMIN (GLUCOPHAGE-XR) 750 MG 24 hr tablet Take 750 mg by mouth at bedtime.    . metoprolol tartrate (LOPRESSOR) 25 MG tablet Take 25 mg by mouth 2 (two) times daily.    . nitroGLYCERIN (NITROSTAT) 0.4 MG SL tablet Place 0.4  mg under the tongue every 5 (five) minutes as needed for chest pain.     No current facility-administered medications on file prior to visit.    LABS/IMAGING: No results found for this or any previous visit (from the past 48 hour(s)). No results found.  LIPID PANEL: No results found for: CHOL, TRIG, HDL, CHOLHDL, VLDL, LDLCALC, LDLDIRECT  WEIGHTS: Wt Readings from Last 3 Encounters:  10/30/19 247 lb 6.4 oz (112.2 kg)  10/09/14 264 lb (119.7 kg)  04/06/14 264 lb (119.7 kg)    VITALS: BP 124/77   Pulse 65   Temp 97.7 F (36.5 C)   Ht 5\' 11"  (1.803 m)   Wt 247 lb 6.4 oz (112.2 kg)   SpO2 97%   BMI 34.51 kg/m   EXAM: General appearance: alert and no distress Neck: no carotid bruit, no JVD and thyroid not enlarged,  symmetric, no tenderness/mass/nodules Lungs: clear to auscultation bilaterally Heart: regular rate and rhythm Abdomen: soft, non-tender; bowel sounds normal; no masses,  no organomegaly Extremities: extremities normal, atraumatic, no cyanosis or edema Pulses: 2+ and symmetric Skin: Skin color, texture, turgor normal. No rashes or lesions Neurologic: Grossly normal Psych: Pleasant  EKG: Normal sinus rhythm at 65-- personally reviewed  ASSESSMENT: 1. CAD with a history of unstable angina-status post coronary stenting ([place], last 1998) 2. Type 2 diabetes-hemoglobin A1c 6.2 3. Hypertension 4. Dyslipidemia, goal LDL less than 70 5. Gout  PLAN: 1.   Mr. Notz is doing very well without any cardiovascular events since his last PCI 1998.  He said well controlled diabetes on metformin, hypertension which is at goal and cholesterol with LDL near 70.  He said well controlled gout and otherwise no other active cardiac issues.  He remains physically active.  He is asymptomatic.  I think he is on an adequate regimen at this time.  Since he has been on longstanding dual antiplatelet therapy without any complications, I will continue that.  Will request full records from Dr. 1999.  Follow-up with me in 6 months or sooner as necessary.  Logan Lull, MD, [facility], FACP  [place]  [facility]  Medical Director of the Advanced Lipid Disorders &  Cardiovascular Risk Reduction Clinic Diplomate of the American Board of Clinical Lipidology Attending Cardiologist  Direct Dial: [phone]  Fax: [phone]  Website:  [email or web address] Raygan Skarda 10/30/2019, 9:48 AM

## 2019-11-16 ENCOUNTER — Ambulatory Visit: Payer: Medicare Other

## 2020-03-24 DIAGNOSIS — M25561 Pain in right knee: Secondary | ICD-10-CM | POA: Diagnosis not present

## 2020-03-24 DIAGNOSIS — M109 Gout, unspecified: Secondary | ICD-10-CM | POA: Diagnosis not present

## 2020-03-24 DIAGNOSIS — M79643 Pain in unspecified hand: Secondary | ICD-10-CM | POA: Diagnosis not present

## 2020-03-24 DIAGNOSIS — M1990 Unspecified osteoarthritis, unspecified site: Secondary | ICD-10-CM | POA: Diagnosis not present

## 2020-05-17 ENCOUNTER — Ambulatory Visit: Payer: Medicare Other | Admitting: Internal Medicine

## 2020-05-26 ENCOUNTER — Telehealth: Payer: Self-pay | Admitting: Internal Medicine

## 2020-05-26 NOTE — Telephone Encounter (Signed)
LVM for patient to return call to get 6 mos follow up scheduled with Hilty from recall list

## 2020-06-08 ENCOUNTER — Other Ambulatory Visit: Payer: Self-pay | Admitting: Internal Medicine

## 2020-06-08 NOTE — Telephone Encounter (Signed)
*  STAT* If patient is at the pharmacy, call can be transferred to refill team.   1. Which medications need to be refilled? (please list name of each medication and dose if known) metoprolol tartrate (LOPRESSOR) 25 MG tablet  2. Which pharmacy/location (including street and city if local pharmacy) is medication to be sent to? [facility] - [place], [place] - [address]  3. Do they need a 30 day or 90 day supply? 90 day   Patient is out of medication. He is requesting Dr. Rennis Golden take over his refills.

## 2020-06-09 MED ORDER — METOPROLOL TARTRATE 25 MG PO TABS: 25.0000 mg | ORAL_TABLET | Freq: Two times a day (BID) | ORAL | 2 refills | Status: DC

## 2020-07-16 DIAGNOSIS — Z23 Encounter for immunization: Secondary | ICD-10-CM | POA: Diagnosis not present

## 2020-08-04 ENCOUNTER — Telehealth: Payer: Self-pay | Admitting: Radiology

## 2020-08-04 ENCOUNTER — Ambulatory Visit: Payer: Medicare Other | Admitting: Internal Medicine

## 2020-08-04 ENCOUNTER — Encounter: Payer: Self-pay | Admitting: Internal Medicine

## 2020-08-04 ENCOUNTER — Other Ambulatory Visit: Payer: Self-pay

## 2020-08-04 VITALS — BP 136/78 | HR 67 | Temp 97.5°F | Ht 71.0 in | Wt 245.0 lb

## 2020-08-04 DIAGNOSIS — I2510 Atherosclerotic heart disease of native coronary artery without angina pectoris: Secondary | ICD-10-CM

## 2020-08-04 DIAGNOSIS — I493 Ventricular premature depolarization: Secondary | ICD-10-CM | POA: Diagnosis not present

## 2020-08-04 DIAGNOSIS — I10 Essential (primary) hypertension: Secondary | ICD-10-CM | POA: Diagnosis not present

## 2020-08-04 DIAGNOSIS — E782 Mixed hyperlipidemia: Secondary | ICD-10-CM

## 2020-08-04 NOTE — Progress Notes (Signed)
OFFICE CONSULT NOTE  Chief Complaint:  Follow-up coronary disease  Primary Care Physician: Merri Brunette, MD  HPI:  Logan Cisneros is a 74 y.o. male who is being seen today for the evaluation of establish cardiologist at the request of Merri Brunette, MD.  This is a pleasant 74 year old male was kindly referred by Dr. Norma Fredrickson to establish as a new patient with me.  He is wife is also a patient of mine.  He has been a longstanding patient of Dr. Sharyn Lull and apparently had 3 cardiac stents the last of which was placed in 1998.  He had had chest pain and unstable angina at least at the time and was symptomatic.  Since then he has done very well.  He is followed with Dr. Sharyn Lull is his primary care physician until recently and is seeing him every 3 months but generally has had no issues.  He also has type 2 diabetes which is well controlled and hemoglobin A1c of 6.2.  There is dyslipidemia with recent lipid testing that showed total cholesterol 134, triglycerides 110, HDL 42 and LDL of 72.  There is some mild chronic kidney disease with creatinine 1.4.  He also has hypertension which is well controlled and blood pressure 124/77 today.  He remains physically active.  There is a history of gout however is well controlled on allopurinol.  He is previously been tested for sleep apnea which was negative.  He denies any chest pain or shortness of breath.  He has been on longstanding dual antiplatelet therapy and denies any bleeding or issues with that.  08/04/2020  Logan Cisneros returns today for follow-up of his coronary disease.  Although we had requested records we were not able to get this from Dr. Sharyn Lull and we will again request those records.  I asked the patient to try to see if he can get his records as well.  He has been asymptomatic denying any chest pain or shortness of breath.  EKG today shows frequent PVCs which are every third or fourth beat.  He seems to be asymptomatic with this.  No history of  this is known.  His last LV function to my knowledge was by nuclear stress testing in 2015.  I will need to review further records about that.  He is on metoprolol but he reported he was out of it for several weeks this year but we refilled it.  PMHx:  Past Medical History:  Diagnosis Date   Anginal pain (HCC)    Arthritis    both thumbs   Diabetes mellitus without complication (HCC)    Hyperlipidemia    hx of   Hypertension     Past Surgical History:  Procedure Laterality Date   CARDIAC CATHETERIZATION     EUS N/A 10/09/2014   Procedure: UPPER ENDOSCOPIC ULTRASOUND (EUS) LINEAR;  Surgeon: Theda Belfast, MD;  Location: [facility];  Service: Endoscopy;  Laterality: N/A;   ROTATOR CUFF REPAIR Bilateral    stent x 3  years ago    FAMHx:  Family History  Problem Relation Age of Onset   Cancer - Prostate Father     SOCHx:   reports that he quit smoking about 45 years ago. His smoking use included cigarettes. He has a 19.50 pack-year smoking history. He has never used smokeless tobacco. He reports current drug use. Drug: Marijuana. He reports that he does not drink alcohol.  ALLERGIES:  No Known Allergies  ROS: Pertinent items noted in HPI and remainder  of comprehensive ROS otherwise negative.  HOME MEDS: Current Outpatient Medications on File Prior to Visit  Medication Sig Dispense Refill   allopurinol (ZYLOPRIM) 100 MG tablet Take 100 mg by mouth daily.   2   aspirin 81 MG tablet Take 81 mg by mouth daily.     atorvastatin (LIPITOR) 80 MG tablet Take 80 mg by mouth daily.     clopidogrel (PLAVIX) 75 MG tablet Take 37.5 mg by mouth daily. Pt takes 1/2 pill     irbesartan (AVAPRO) 300 MG tablet Take 300 mg by mouth daily.     metFORMIN (GLUCOPHAGE-XR) 750 MG 24 hr tablet Take 750 mg by mouth in the morning and at bedtime.      metoprolol tartrate (LOPRESSOR) 25 MG tablet Take 1 tablet (25 mg total) by mouth 2 (two) times daily. 180 tablet 2    nitroGLYCERIN (NITROSTAT) 0.4 MG SL tablet Place 0.4 mg under the tongue every 5 (five) minutes as needed for chest pain.     No current facility-administered medications on file prior to visit.    LABS/IMAGING: No results found for this or any previous visit (from the past 48 hour(s)). No results found.  LIPID PANEL: No results found for: CHOL, TRIG, HDL, CHOLHDL, VLDL, LDLCALC, LDLDIRECT  WEIGHTS: Wt Readings from Last 3 Encounters:  08/04/20 245 lb (111.1 kg)  10/30/19 247 lb 6.4 oz (112.2 kg)  10/09/14 264 lb (119.7 kg)    VITALS: BP 136/78 (BP Location: Left Arm, Patient Position: Sitting, Cuff Size: Large)    Pulse 67    Temp (!) 97.5 F (36.4 C)    Ht 5\' 11"  (1.803 m)    Wt 245 lb (111.1 kg)    BMI 34.17 kg/m   EXAM: General appearance: alert and no distress Neck: no carotid bruit, no JVD and thyroid not enlarged, symmetric, no tenderness/mass/nodules Lungs: clear to auscultation bilaterally Heart: regular rate and rhythm Abdomen: soft, non-tender; bowel sounds normal; no masses,  no organomegaly Extremities: extremities normal, atraumatic, no cyanosis or edema Pulses: 2+ and symmetric Skin: Skin color, texture, turgor normal. No rashes or lesions Neurologic: Grossly normal Psych: Pleasant  EKG: Sinus rhythm with frequent PVCs at 67-personally reviewed  ASSESSMENT: 1. CAD with a history of unstable angina-status post coronary stenting (Harwani, last 1998) 2. Type 2 diabetes-hemoglobin A1c 6.2 3. Hypertension 4. Dyslipidemia, goal LDL less than 70 5. Gout 6. Frequent PVCs-asymptomatic  PLAN: 1.   Logan Cisneros did have frequent PVCs today.  He is asymptomatic with this.  This could put him at risk for tachycardia induced cardiomyopathy if his burden is high.  I recommend a 2-week monitor to see how many of these PVCs he is having.  He is already on some beta-blocker.  Ultimately may need antiarrhythmic suppression however I would like to get more records regarding  his history of coronary disease from his previous cardiologist.  We will plan to review these records and his monitor results back in the office in about 4 to 6 weeks.  Emelda Fear, MD, [facility], FACP  [place]   [facility]  Medical Director of the Advanced Lipid Disorders &  Cardiovascular Risk Reduction Clinic Diplomate of the American Board of Clinical Lipidology  Attending Cardiologist  Direct Dial: [phone]   Fax: [phone]  Website:  [email or web address] Felicie Kocher 08/04/2020, 11:38 AM

## 2020-08-04 NOTE — Telephone Encounter (Signed)
Enrolled patient for a 14 day Zio XT  monitor to be mailed to patients home  °

## 2020-08-04 NOTE — Patient Instructions (Signed)
Medication Instructions:  Your physician recommends that you continue on your current medications as directed. Please refer to the Current Medication list given to you today.  *If you need a refill on your cardiac medications before your next appointment, please call your pharmacy*   Testing/Procedures: ZIO Monitor Instructions   Your physician has requested you wear your ZIO patch monitor___14___days.   This is a single patch monitor.  [employer] supplies one patch monitor per enrollment.  Additional stickers are not available.   Please do not apply patch if you will be having a Nuclear Stress Test, Echocardiogram, Cardiac CT, MRI, or Chest Xray during the time frame you would be wearing the monitor. The patch cannot be worn during these tests.  You cannot remove and re-apply the ZIO XT patch monitor.   Your ZIO patch monitor will be sent USPS Priority mail from [employer] directly to your home address. The monitor may also be mailed to a PO BOX if home delivery is not available.   It may take 3-5 days to receive your monitor after you have been enrolled.   Once you have received you monitor, please review enclosed instructions.  Your monitor has already been registered assigning a specific monitor serial # to you.   Applying the monitor   Shave hair from upper left chest.   Hold abrader disc by orange tab.  Rub abrader in 40 strokes over left upper chest as indicated in your monitor instructions.   Clean area with 4 enclosed alcohol pads .  Use all pads to assure are is cleaned thoroughly.  Let dry.   Apply patch as indicated in monitor instructions.  Patch will be place under collarbone on left side of chest with arrow pointing upward.   Rub patch adhesive wings for 2 minutes.Remove white label marked "1".  Remove white label marked "2".  Rub patch adhesive wings for 2 additional minutes.   While looking in a mirror, press and release button in center of patch.  A small green  light will flash 3-4 times .  This will be your only indicator the monitor has been turned on.     Do not shower for the first 24 hours.  You may shower after the first 24 hours.   Press button if you feel a symptom. You will hear a small click.  Record Date, Time and Symptom in the Patient Log Book.   When you are ready to remove patch, follow instructions on last 2 pages of Patient Log Book.  Stick patch monitor onto last page of Patient Log Book.   Place Patient Log Book in Blue box.  Use locking tab on box and tape box closed securely.  The Orange and White box has prepaid postage on it.  Please place in mailbox as soon as possible.  Your physician should have your test results approximately 7 days after the monitor has been mailed back to [employer].   Call [employer] Customer Care at [phone] if you have questions regarding your ZIO XT patch monitor.  Call them immediately if you see an orange light blinking on your monitor.   If your monitor falls off in less than 4 days contact our Monitor department at [phone].  If your monitor becomes loose or falls off after 4 days call [employer] at [phone] for suggestions on securing your monitor.     Follow-Up: At [facility], you and your health needs are our priority.  As part of our continuing mission   to provide you with exceptional heart care, we have created designated Provider Care Teams.  These Care Teams include your primary Cardiologist (physician) and Advanced Practice Providers (APPs -  Physician Assistants and Nurse Practitioners) who all work together to provide you with the care you need, when you need it.  We recommend signing up for the patient portal called "MyChart".  Sign up information is provided on this After Visit Summary.  MyChart is used to connect with patients for Virtual Visits (Telemedicine).  Patients are able to view lab/test results, encounter notes, upcoming appointments, etc.  Non-urgent  messages can be sent to your provider as well.   To learn more about what you can do with MyChart, go to [email or web address].    Your next appointment:   4-6 week(s)  The format for your next appointment:   In Person  Provider:   You may see Dr. Hilty or one of the following Advanced Practice Providers on your designated Care Team:    Hao Meng, PA-C  Angela Duke, PA-C or   Krista Kroeger, PA-C    Other Instructions   

## 2020-08-07 ENCOUNTER — Ambulatory Visit: Payer: Medicare Other | Attending: Internal Medicine

## 2020-08-07 ENCOUNTER — Other Ambulatory Visit (INDEPENDENT_AMBULATORY_CARE_PROVIDER_SITE_OTHER): Payer: Medicare Other

## 2020-08-07 ENCOUNTER — Other Ambulatory Visit: Payer: Self-pay

## 2020-08-07 DIAGNOSIS — I493 Ventricular premature depolarization: Secondary | ICD-10-CM

## 2020-08-07 DIAGNOSIS — Z23 Encounter for immunization: Secondary | ICD-10-CM

## 2020-08-07 NOTE — Progress Notes (Signed)
   Covid-19 Vaccination Clinic  Name:  DEMARIUS ARCHILA    MRN: [id number] DOB: 07/12/1946  08/07/2020  Mr. Doering was observed post Covid-19 immunization for 15 minutes without incident. He was provided with Vaccine Information Sheet and instruction to access the V-Safe system.   Mr. Desa was instructed to call 911 with any severe reactions post vaccine: Marland Kitchen Difficulty breathing  . Swelling of face and throat  . A fast heartbeat  . A bad rash all over body  . Dizziness and weakness

## 2020-08-25 DIAGNOSIS — Z125 Encounter for screening for malignant neoplasm of prostate: Secondary | ICD-10-CM | POA: Diagnosis not present

## 2020-08-25 DIAGNOSIS — E119 Type 2 diabetes mellitus without complications: Secondary | ICD-10-CM | POA: Diagnosis not present

## 2020-08-25 DIAGNOSIS — E7800 Pure hypercholesterolemia, unspecified: Secondary | ICD-10-CM | POA: Diagnosis not present

## 2020-08-25 DIAGNOSIS — I10 Essential (primary) hypertension: Secondary | ICD-10-CM | POA: Diagnosis not present

## 2020-08-31 DIAGNOSIS — I10 Essential (primary) hypertension: Secondary | ICD-10-CM | POA: Diagnosis not present

## 2020-08-31 DIAGNOSIS — Z125 Encounter for screening for malignant neoplasm of prostate: Secondary | ICD-10-CM | POA: Diagnosis not present

## 2020-08-31 DIAGNOSIS — I493 Ventricular premature depolarization: Secondary | ICD-10-CM | POA: Diagnosis not present

## 2020-08-31 DIAGNOSIS — E119 Type 2 diabetes mellitus without complications: Secondary | ICD-10-CM | POA: Diagnosis not present

## 2020-09-01 DIAGNOSIS — Z0001 Encounter for general adult medical examination with abnormal findings: Secondary | ICD-10-CM | POA: Diagnosis not present

## 2020-09-01 DIAGNOSIS — E1121 Type 2 diabetes mellitus with diabetic nephropathy: Secondary | ICD-10-CM | POA: Diagnosis not present

## 2020-09-01 DIAGNOSIS — N1831 Chronic kidney disease, stage 3a: Secondary | ICD-10-CM | POA: Diagnosis not present

## 2020-09-01 DIAGNOSIS — E871 Hypo-osmolality and hyponatremia: Secondary | ICD-10-CM | POA: Diagnosis not present

## 2020-09-03 ENCOUNTER — Encounter: Payer: Self-pay | Admitting: Internal Medicine

## 2020-09-03 ENCOUNTER — Other Ambulatory Visit: Payer: Self-pay

## 2020-09-03 ENCOUNTER — Ambulatory Visit: Payer: Medicare Other | Admitting: Internal Medicine

## 2020-09-03 VITALS — BP 140/72 | HR 74 | Ht 69.0 in | Wt 239.0 lb

## 2020-09-03 DIAGNOSIS — I10 Essential (primary) hypertension: Secondary | ICD-10-CM | POA: Diagnosis not present

## 2020-09-03 DIAGNOSIS — I2510 Atherosclerotic heart disease of native coronary artery without angina pectoris: Secondary | ICD-10-CM

## 2020-09-03 DIAGNOSIS — I493 Ventricular premature depolarization: Secondary | ICD-10-CM

## 2020-09-03 DIAGNOSIS — E782 Mixed hyperlipidemia: Secondary | ICD-10-CM | POA: Diagnosis not present

## 2020-09-03 MED ORDER — METOPROLOL TARTRATE 50 MG PO TABS: 50.0000 mg | ORAL_TABLET | Freq: Two times a day (BID) | ORAL | 3 refills | Status: DC

## 2020-09-03 NOTE — Patient Instructions (Signed)
Medication Instructions:  INCREASE metoprolol tartrate to 50mg  twice daily  *If you need a refill on your cardiac medications before your next appointment, please call your pharmacy*  Follow-Up: At [facility], you and your health needs are our priority.  As part of our continuing mission to provide you with exceptional heart care, we have created designated Provider Care Teams.  These Care Teams include your primary Cardiologist (physician) and Advanced Practice Providers (APPs -  Physician Assistants and Nurse Practitioners) who all work together to provide you with the care you need, when you need it.  We recommend signing up for the patient portal called "MyChart".  Sign up information is provided on this After Visit Summary.  MyChart is used to connect with patients for Virtual Visits (Telemedicine).  Patients are able to view lab/test results, encounter notes, upcoming appointments, etc.  Non-urgent messages can be sent to your provider as well.   To learn more about what you can do with MyChart, go to [facility].    Your next appointment:   6 month(s)  The format for your next appointment:   In Person  Provider:   You may see Dr. [email or web address] or one of the following Advanced Practice Providers on your designated Care Team:    Rennis Golden, PA-C  Azalee Course, Micah Flesher or   [place], Judy Pimple    Other Instructions

## 2020-09-03 NOTE — Progress Notes (Signed)
OFFICE CONSULT NOTE  Chief Complaint:  Follow-up coronary disease  Primary Care Physician: Merri Brunette, MD  HPI:  Logan Cisneros is a 74 y.o. male who is being seen today for the evaluation of establish cardiologist at the request of Merri Brunette, MD.  This is a pleasant 74 year old male was kindly referred by Dr. Norma Fredrickson to establish as a new patient with me.  He is wife is also a patient of mine.  He has been a longstanding patient of Dr. Sharyn Lull and apparently had 3 cardiac stents the last of which was placed in 1998.  He had had chest pain and unstable angina at least at the time and was symptomatic.  Since then he has done very well.  He is followed with Dr. Sharyn Lull is his primary care physician until recently and is seeing him every 3 months but generally has had no issues.  He also has type 2 diabetes which is well controlled and hemoglobin A1c of 6.2.  There is dyslipidemia with recent lipid testing that showed total cholesterol 134, triglycerides 110, HDL 42 and LDL of 72.  There is some mild chronic kidney disease with creatinine 1.4.  He also has hypertension which is well controlled and blood pressure 124/77 today.  He remains physically active.  There is a history of gout however is well controlled on allopurinol.  He is previously been tested for sleep apnea which was negative.  He denies any chest pain or shortness of breath.  He has been on longstanding dual antiplatelet therapy and denies any bleeding or issues with that.  08/04/2020  Logan Cisneros returns today for follow-up of his coronary disease.  Although we had requested records we were not able to get this from Dr. Sharyn Lull and we will again request those records.  I asked the patient to try to see if he can get his records as well.  He has been asymptomatic denying any chest pain or shortness of breath.  EKG today shows frequent PVCs which are every third or fourth beat.  He seems to be asymptomatic with this.  No history  of this is known.  His last LV function to my knowledge was by nuclear stress testing in 2015.  I will need to review further records about that.  He is on metoprolol but he reported he was out of it for several weeks this year but we refilled it.  09/03/2020  Logan Cisneros returns today for follow-up of coronary disease.  He did wear a monitor which showed an increased burden of PVCs with about 50,000 PVCs or 3% of the monitoring study.  There were 2 small episodes of PSVT.  It is not clear that he was symptomatic with this.  He did say that he had some sharp chest pain which lasted a few seconds toward the end of the monitoring period but did not record it.  Otherwise he was asymptomatic with this.  Blood pressure is a little elevated today.  We have not yet received all the records from Dr. Sharyn Lull.  PMHx:  Past Medical History:  Diagnosis Date  . Anginal pain (HCC)   . Arthritis    both thumbs  . Diabetes mellitus without complication (HCC)   . Hyperlipidemia    hx of  . Hypertension     Past Surgical History:  Procedure Laterality Date  . CARDIAC CATHETERIZATION    . EUS N/A 10/09/2014   Procedure: UPPER ENDOSCOPIC ULTRASOUND (EUS) LINEAR;  Surgeon: Luisa Hart  Jamelle Haring, MD;  Location: [facility];  Service: Endoscopy;  Laterality: N/A;  . ROTATOR CUFF REPAIR Bilateral   . stent x 3  years ago    FAMHx:  Family History  Problem Relation Age of Onset  . Cancer - Prostate Father     SOCHx:   reports that he quit smoking about 45 years ago. His smoking use included cigarettes. He has a 19.50 pack-year smoking history. He has never used smokeless tobacco. He reports current drug use. Drug: Marijuana. He reports that he does not drink alcohol.  ALLERGIES:  No Known Allergies  ROS: Pertinent items noted in HPI and remainder of comprehensive ROS otherwise negative.  HOME MEDS: Current Outpatient Medications on File Prior to Visit  Medication Sig Dispense Refill  . allopurinol  (ZYLOPRIM) 100 MG tablet Take 100 mg by mouth daily.   2  . aspirin 81 MG tablet Take 81 mg by mouth daily.    Marland Kitchen atorvastatin (LIPITOR) 80 MG tablet Take 80 mg by mouth daily.    . clopidogrel (PLAVIX) 75 MG tablet Take 37.5 mg by mouth daily. Pt takes 1/2 pill    . irbesartan (AVAPRO) 300 MG tablet Take 300 mg by mouth daily.    . metFORMIN (GLUCOPHAGE-XR) 750 MG 24 hr tablet Take 750 mg by mouth in the morning and at bedtime.     . metoprolol tartrate (LOPRESSOR) 25 MG tablet Take 1 tablet (25 mg total) by mouth 2 (two) times daily. 180 tablet 2  . nitroGLYCERIN (NITROSTAT) 0.4 MG SL tablet Place 0.4 mg under the tongue every 5 (five) minutes as needed for chest pain.     No current facility-administered medications on file prior to visit.    LABS/IMAGING: No results found for this or any previous visit (from the past 48 hour(s)). No results found.  LIPID PANEL: No results found for: CHOL, TRIG, HDL, CHOLHDL, VLDL, LDLCALC, LDLDIRECT  WEIGHTS: Wt Readings from Last 3 Encounters:  09/03/20 239 lb (108.4 kg)  08/04/20 245 lb (111.1 kg)  10/30/19 247 lb 6.4 oz (112.2 kg)    VITALS: BP 140/72 (BP Location: Left Arm, Patient Position: Sitting)   Pulse 74   Ht 5\' 9"  (1.753 m)   Wt 239 lb (108.4 kg)   SpO2 98%   BMI 35.29 kg/m   EXAM: Deferred  EKG: Deferred  ASSESSMENT: 1. CAD with a history of unstable angina-status post coronary stenting (Harwani, last 1998) 2. Type 2 diabetes-hemoglobin A1c 6.2 3. Hypertension 4. Dyslipidemia, goal LDL less than 70 5. Gout 6. Frequent PVCs-asymptomatic  PLAN: 1.   Logan Cisneros was noted to have an increased burden of PVCs of about 3% or 50,000 during the study.  He also had a couple runs of PSVT.  While I think this is unlikely to cause cardiomyopathy, I think there is other indications for increase in beta-blockade including PVC and/or SVT suppression, better blood pressure control, treatment of his coronary disease, etc.  Recommend  increasing his metoprolol from 25 to 50 mg twice daily.  Plan follow-up with me in 6 months.  Emelda Fear, MD, [facility], FACP  [place]  [facility]  Medical Director of the Advanced Lipid Disorders &  Cardiovascular Risk Reduction Clinic Diplomate of the American Board of Clinical Lipidology  Attending Cardiologist  Direct Dial: [phone]  Fax: [phone]  Website:  [email or web address] 09/03/2020, 3:54 PM

## 2020-12-22 DIAGNOSIS — Z0000 Encounter for general adult medical examination without abnormal findings: Secondary | ICD-10-CM | POA: Diagnosis not present

## 2020-12-22 DIAGNOSIS — E1121 Type 2 diabetes mellitus with diabetic nephropathy: Secondary | ICD-10-CM | POA: Diagnosis not present

## 2021-01-07 DIAGNOSIS — M1990 Unspecified osteoarthritis, unspecified site: Secondary | ICD-10-CM | POA: Diagnosis not present

## 2021-01-07 DIAGNOSIS — M79643 Pain in unspecified hand: Secondary | ICD-10-CM | POA: Diagnosis not present

## 2021-01-07 DIAGNOSIS — M109 Gout, unspecified: Secondary | ICD-10-CM | POA: Diagnosis not present

## 2021-01-07 DIAGNOSIS — M25561 Pain in right knee: Secondary | ICD-10-CM | POA: Diagnosis not present

## 2021-01-19 DIAGNOSIS — E7800 Pure hypercholesterolemia, unspecified: Secondary | ICD-10-CM | POA: Diagnosis not present

## 2021-01-19 DIAGNOSIS — E875 Hyperkalemia: Secondary | ICD-10-CM | POA: Diagnosis not present

## 2021-01-19 DIAGNOSIS — Z955 Presence of coronary angioplasty implant and graft: Secondary | ICD-10-CM | POA: Diagnosis not present

## 2021-01-19 DIAGNOSIS — I10 Essential (primary) hypertension: Secondary | ICD-10-CM | POA: Diagnosis not present

## 2021-01-28 DIAGNOSIS — E119 Type 2 diabetes mellitus without complications: Secondary | ICD-10-CM | POA: Diagnosis not present

## 2021-01-28 DIAGNOSIS — M79672 Pain in left foot: Secondary | ICD-10-CM | POA: Diagnosis not present

## 2021-01-28 DIAGNOSIS — L84 Corns and callosities: Secondary | ICD-10-CM | POA: Diagnosis not present

## 2021-04-13 DIAGNOSIS — E1121 Type 2 diabetes mellitus with diabetic nephropathy: Secondary | ICD-10-CM | POA: Diagnosis not present

## 2021-04-13 DIAGNOSIS — E7800 Pure hypercholesterolemia, unspecified: Secondary | ICD-10-CM | POA: Diagnosis not present

## 2021-04-13 DIAGNOSIS — I10 Essential (primary) hypertension: Secondary | ICD-10-CM | POA: Diagnosis not present

## 2021-04-13 DIAGNOSIS — Z7902 Long term (current) use of antithrombotics/antiplatelets: Secondary | ICD-10-CM | POA: Diagnosis not present

## 2021-04-20 DIAGNOSIS — E1121 Type 2 diabetes mellitus with diabetic nephropathy: Secondary | ICD-10-CM | POA: Diagnosis not present

## 2021-04-20 DIAGNOSIS — I2510 Atherosclerotic heart disease of native coronary artery without angina pectoris: Secondary | ICD-10-CM | POA: Diagnosis not present

## 2021-04-20 DIAGNOSIS — E7800 Pure hypercholesterolemia, unspecified: Secondary | ICD-10-CM | POA: Diagnosis not present

## 2021-04-20 DIAGNOSIS — I10 Essential (primary) hypertension: Secondary | ICD-10-CM | POA: Diagnosis not present

## 2021-05-25 DIAGNOSIS — E1121 Type 2 diabetes mellitus with diabetic nephropathy: Secondary | ICD-10-CM | POA: Diagnosis not present

## 2021-05-25 DIAGNOSIS — I10 Essential (primary) hypertension: Secondary | ICD-10-CM | POA: Diagnosis not present

## 2021-05-25 DIAGNOSIS — I2510 Atherosclerotic heart disease of native coronary artery without angina pectoris: Secondary | ICD-10-CM | POA: Diagnosis not present

## 2021-05-25 DIAGNOSIS — E7800 Pure hypercholesterolemia, unspecified: Secondary | ICD-10-CM | POA: Diagnosis not present

## 2021-07-14 DIAGNOSIS — M79643 Pain in unspecified hand: Secondary | ICD-10-CM | POA: Diagnosis not present

## 2021-07-14 DIAGNOSIS — M109 Gout, unspecified: Secondary | ICD-10-CM | POA: Diagnosis not present

## 2021-07-14 DIAGNOSIS — M25561 Pain in right knee: Secondary | ICD-10-CM | POA: Diagnosis not present

## 2021-07-14 DIAGNOSIS — M1990 Unspecified osteoarthritis, unspecified site: Secondary | ICD-10-CM | POA: Diagnosis not present

## 2021-07-27 DIAGNOSIS — E7800 Pure hypercholesterolemia, unspecified: Secondary | ICD-10-CM | POA: Diagnosis not present

## 2021-07-27 DIAGNOSIS — I10 Essential (primary) hypertension: Secondary | ICD-10-CM | POA: Diagnosis not present

## 2021-07-27 DIAGNOSIS — E1121 Type 2 diabetes mellitus with diabetic nephropathy: Secondary | ICD-10-CM | POA: Diagnosis not present

## 2021-07-27 DIAGNOSIS — Z955 Presence of coronary angioplasty implant and graft: Secondary | ICD-10-CM | POA: Diagnosis not present

## 2021-08-15 ENCOUNTER — Other Ambulatory Visit: Payer: Self-pay | Admitting: Internal Medicine

## 2021-09-05 DIAGNOSIS — I10 Essential (primary) hypertension: Secondary | ICD-10-CM | POA: Diagnosis not present

## 2021-09-05 DIAGNOSIS — E7801 Familial hypercholesterolemia: Secondary | ICD-10-CM | POA: Diagnosis not present

## 2021-09-05 DIAGNOSIS — Z125 Encounter for screening for malignant neoplasm of prostate: Secondary | ICD-10-CM | POA: Diagnosis not present

## 2021-09-05 DIAGNOSIS — E119 Type 2 diabetes mellitus without complications: Secondary | ICD-10-CM | POA: Diagnosis not present

## 2021-09-07 DIAGNOSIS — E1121 Type 2 diabetes mellitus with diabetic nephropathy: Secondary | ICD-10-CM | POA: Diagnosis not present

## 2021-09-07 DIAGNOSIS — N1831 Chronic kidney disease, stage 3a: Secondary | ICD-10-CM | POA: Diagnosis not present

## 2021-09-07 DIAGNOSIS — M109 Gout, unspecified: Secondary | ICD-10-CM | POA: Diagnosis not present

## 2021-09-07 DIAGNOSIS — Z0000 Encounter for general adult medical examination without abnormal findings: Secondary | ICD-10-CM | POA: Diagnosis not present

## 2021-10-27 ENCOUNTER — Telehealth: Payer: Self-pay | Admitting: Internal Medicine

## 2021-10-27 DIAGNOSIS — Z20822 Contact with and (suspected) exposure to covid-19: Secondary | ICD-10-CM | POA: Diagnosis not present

## 2021-10-27 DIAGNOSIS — Z03818 Encounter for observation for suspected exposure to other biological agents ruled out: Secondary | ICD-10-CM | POA: Diagnosis not present

## 2021-10-27 NOTE — Telephone Encounter (Signed)
Logan Cisneros is calling stating he spoke with his pharmacy today and was discussing an incident with them and they advised him to call in and report this. On 01/03 he was on the phone with a family member and while talking to him he was unable to get his words out. What he was saying was not making sense. He states he knew what he wanted to say, it just would not come out right. This lasted for 5-10 minutes. He also reports he had a headache and when he coughed it got worse. He did not complain of any symptoms at the time of the call. Please advise.

## 2021-10-27 NOTE — Telephone Encounter (Signed)
Spoke to patient he stated yesterday while talking to a cousin on phone he had a bad headache and was unable to communicate what he was thinking.Stated he did not have slurred speech,just unable to communicate his thoughts.Stated episode lasted appox 5 to 10 mins.He has not had any episodes today.Advised he needs to call PCP.Advised if he has any more episodes he needs to call 911 and go to ED.I will make Dr.Hilty aware.

## 2021-10-28 DIAGNOSIS — R519 Headache, unspecified: Secondary | ICD-10-CM | POA: Diagnosis not present

## 2021-10-28 DIAGNOSIS — N1831 Chronic kidney disease, stage 3a: Secondary | ICD-10-CM | POA: Diagnosis not present

## 2021-10-28 DIAGNOSIS — R410 Disorientation, unspecified: Secondary | ICD-10-CM | POA: Diagnosis not present

## 2021-10-28 DIAGNOSIS — I10 Essential (primary) hypertension: Secondary | ICD-10-CM | POA: Diagnosis not present

## 2021-11-02 DIAGNOSIS — R21 Rash and other nonspecific skin eruption: Secondary | ICD-10-CM | POA: Diagnosis not present

## 2021-11-02 DIAGNOSIS — Z955 Presence of coronary angioplasty implant and graft: Secondary | ICD-10-CM | POA: Diagnosis not present

## 2021-11-02 DIAGNOSIS — I2510 Atherosclerotic heart disease of native coronary artery without angina pectoris: Secondary | ICD-10-CM | POA: Diagnosis not present

## 2021-11-02 DIAGNOSIS — E1121 Type 2 diabetes mellitus with diabetic nephropathy: Secondary | ICD-10-CM | POA: Diagnosis not present

## 2021-11-22 DIAGNOSIS — E1121 Type 2 diabetes mellitus with diabetic nephropathy: Secondary | ICD-10-CM | POA: Diagnosis not present

## 2021-11-22 DIAGNOSIS — N1831 Chronic kidney disease, stage 3a: Secondary | ICD-10-CM | POA: Diagnosis not present

## 2021-11-22 DIAGNOSIS — E7800 Pure hypercholesterolemia, unspecified: Secondary | ICD-10-CM | POA: Diagnosis not present

## 2021-11-22 DIAGNOSIS — I10 Essential (primary) hypertension: Secondary | ICD-10-CM | POA: Diagnosis not present

## 2021-12-17 ENCOUNTER — Other Ambulatory Visit: Payer: Self-pay | Admitting: Internal Medicine

## 2021-12-22 ENCOUNTER — Other Ambulatory Visit: Payer: Self-pay

## 2022-01-05 ENCOUNTER — Other Ambulatory Visit: Payer: Self-pay | Admitting: Internal Medicine

## 2022-01-11 DIAGNOSIS — M25561 Pain in right knee: Secondary | ICD-10-CM | POA: Diagnosis not present

## 2022-01-11 DIAGNOSIS — M1990 Unspecified osteoarthritis, unspecified site: Secondary | ICD-10-CM | POA: Diagnosis not present

## 2022-01-11 DIAGNOSIS — M109 Gout, unspecified: Secondary | ICD-10-CM | POA: Diagnosis not present

## 2022-01-11 DIAGNOSIS — M79643 Pain in unspecified hand: Secondary | ICD-10-CM | POA: Diagnosis not present

## 2022-01-25 ENCOUNTER — Other Ambulatory Visit: Payer: Self-pay | Admitting: Internal Medicine

## 2022-01-25 DIAGNOSIS — E1121 Type 2 diabetes mellitus with diabetic nephropathy: Secondary | ICD-10-CM | POA: Diagnosis not present

## 2022-01-25 DIAGNOSIS — Z955 Presence of coronary angioplasty implant and graft: Secondary | ICD-10-CM | POA: Diagnosis not present

## 2022-01-25 DIAGNOSIS — I2510 Atherosclerotic heart disease of native coronary artery without angina pectoris: Secondary | ICD-10-CM | POA: Diagnosis not present

## 2022-01-25 DIAGNOSIS — E7800 Pure hypercholesterolemia, unspecified: Secondary | ICD-10-CM | POA: Diagnosis not present

## 2022-01-27 ENCOUNTER — Other Ambulatory Visit: Payer: Self-pay

## 2022-02-01 DIAGNOSIS — E7841 Elevated Lipoprotein(a): Secondary | ICD-10-CM | POA: Diagnosis not present

## 2022-02-01 DIAGNOSIS — Z955 Presence of coronary angioplasty implant and graft: Secondary | ICD-10-CM | POA: Diagnosis not present

## 2022-02-01 DIAGNOSIS — E7800 Pure hypercholesterolemia, unspecified: Secondary | ICD-10-CM | POA: Diagnosis not present

## 2022-02-01 DIAGNOSIS — E1121 Type 2 diabetes mellitus with diabetic nephropathy: Secondary | ICD-10-CM | POA: Diagnosis not present

## 2022-02-24 ENCOUNTER — Other Ambulatory Visit: Payer: Self-pay | Admitting: Internal Medicine

## 2022-04-01 DIAGNOSIS — H2513 Age-related nuclear cataract, bilateral: Secondary | ICD-10-CM | POA: Diagnosis not present

## 2022-04-01 DIAGNOSIS — H40033 Anatomical narrow angle, bilateral: Secondary | ICD-10-CM | POA: Diagnosis not present

## 2022-04-04 DIAGNOSIS — H524 Presbyopia: Secondary | ICD-10-CM | POA: Diagnosis not present

## 2022-04-28 DIAGNOSIS — E1121 Type 2 diabetes mellitus with diabetic nephropathy: Secondary | ICD-10-CM | POA: Diagnosis not present

## 2022-04-28 DIAGNOSIS — Z955 Presence of coronary angioplasty implant and graft: Secondary | ICD-10-CM | POA: Diagnosis not present

## 2022-04-28 DIAGNOSIS — I10 Essential (primary) hypertension: Secondary | ICD-10-CM | POA: Diagnosis not present

## 2022-04-28 DIAGNOSIS — E7800 Pure hypercholesterolemia, unspecified: Secondary | ICD-10-CM | POA: Diagnosis not present

## 2022-05-30 ENCOUNTER — Other Ambulatory Visit: Payer: Self-pay | Admitting: Internal Medicine

## 2022-05-31 DIAGNOSIS — E7800 Pure hypercholesterolemia, unspecified: Secondary | ICD-10-CM | POA: Diagnosis not present

## 2022-05-31 DIAGNOSIS — E7841 Elevated Lipoprotein(a): Secondary | ICD-10-CM | POA: Diagnosis not present

## 2022-05-31 DIAGNOSIS — E1121 Type 2 diabetes mellitus with diabetic nephropathy: Secondary | ICD-10-CM | POA: Diagnosis not present

## 2022-05-31 DIAGNOSIS — Z955 Presence of coronary angioplasty implant and graft: Secondary | ICD-10-CM | POA: Diagnosis not present

## 2022-07-06 ENCOUNTER — Ambulatory Visit: Payer: Medicare Other | Attending: Internal Medicine | Admitting: Internal Medicine

## 2022-07-06 ENCOUNTER — Encounter: Payer: Self-pay | Admitting: Internal Medicine

## 2022-07-06 VITALS — BP 122/70 | HR 79 | Ht 71.0 in | Wt 221.6 lb

## 2022-07-06 DIAGNOSIS — E782 Mixed hyperlipidemia: Secondary | ICD-10-CM | POA: Diagnosis not present

## 2022-07-06 DIAGNOSIS — I10 Essential (primary) hypertension: Secondary | ICD-10-CM

## 2022-07-06 DIAGNOSIS — I2510 Atherosclerotic heart disease of native coronary artery without angina pectoris: Secondary | ICD-10-CM | POA: Diagnosis not present

## 2022-07-06 DIAGNOSIS — I493 Ventricular premature depolarization: Secondary | ICD-10-CM

## 2022-07-06 NOTE — Progress Notes (Addendum)
OFFICE CONSULT NOTE  Chief Complaint:  Follow-up  Primary Care Physician: Merri Brunette, MD  HPI:  Logan Cisneros is a 76 y.o. male who is being seen today for the evaluation of establish cardiologist at the request of Merri Brunette, MD.  This is a pleasant 76 year old male was kindly referred by Dr. Norma Fredrickson to establish as a new patient with me.  He is wife is also a patient of mine.  He has been a longstanding patient of Dr. Sharyn Lull and apparently had 3 cardiac stents the last of which was placed in 1998.  He had had chest pain and unstable angina at least at the time and was symptomatic.  Since then he has done very well.  He is followed with Dr. Sharyn Lull is his primary care physician until recently and is seeing him every 3 months but generally has had no issues.  He also has type 2 diabetes which is well controlled and hemoglobin A1c of 6.2.  There is dyslipidemia with recent lipid testing that showed total cholesterol 134, triglycerides 110, HDL 42 and LDL of 72.  There is some mild chronic kidney disease with creatinine 1.4.  He also has hypertension which is well controlled and blood pressure 124/77 today.  He remains physically active.  There is a history of gout however is well controlled on allopurinol.  He is previously been tested for sleep apnea which was negative.  He denies any chest pain or shortness of breath.  He has been on longstanding dual antiplatelet therapy and denies any bleeding or issues with that.  08/04/2020  Logan Cisneros returns today for follow-up of his coronary disease.  Although we had requested records we were not able to get this from Dr. Sharyn Lull and we will again request those records.  I asked the patient to try to see if he can get his records as well.  He has been asymptomatic denying any chest pain or shortness of breath.  EKG today shows frequent PVCs which are every third or fourth beat.  He seems to be asymptomatic with this.  No history of this is  known.  His last LV function to my knowledge was by nuclear stress testing in 2015.  I will need to review further records about that.  He is on metoprolol but he reported he was out of it for several weeks this year but we refilled it.  09/03/2020  Logan Cisneros returns today for follow-up of coronary disease.  He did wear a monitor which showed an increased burden of PVCs with about 50,000 PVCs or 3% of the monitoring study.  There were 2 small episodes of PSVT.  It is not clear that he was symptomatic with this.  He did say that he had some sharp chest pain which lasted a few seconds toward the end of the monitoring period but did not record it.  Otherwise he was asymptomatic with this.  Blood pressure is a little elevated today.  We have not yet received all the records from Dr. Sharyn Lull.  07/06/2022  Logan Cisneros is seen today in follow-up.  He seems to be doing well.  He is still having some PVCs but he is asymptomatic with this.  I previously increased his metoprolol.  Blood pressure is well controlled today 122/70.  I noted that he has been on aspirin 81 mg and clopidogrel 37.5 mg daily.  Apparently his previous cardiologist had advised him to take a half dose of that medication.  I am not aware of any data that this is associated with any additional protection however bleeding risks are higher.  Its been since 1998, I believe since he had his last stent.  He has had no angina.  I would advise stopping his clopidogrel.  We did receive labs from his PCP.  In July his total cholesterol was 114, HDL 42, triglycerides 96 and LDL 53.  Hemoglobin A1c was 6.2%.  PMHx:  Past Medical History:  Diagnosis Date   Anginal pain (HCC)    Arthritis    both thumbs   Diabetes mellitus without complication (HCC)    Hyperlipidemia    hx of   Hypertension     Past Surgical History:  Procedure Laterality Date   CARDIAC CATHETERIZATION     EUS N/A 10/09/2014   Procedure: UPPER ENDOSCOPIC ULTRASOUND (EUS)  LINEAR;  Surgeon: Theda Belfast, MD;  Location: [facility];  Service: Endoscopy;  Laterality: N/A;   ROTATOR CUFF REPAIR Bilateral    stent x 3  years ago    FAMHx:  Family History  Problem Relation Age of Onset   Cancer - Prostate Father     SOCHx:   reports that he quit smoking about 47 years ago. His smoking use included cigarettes. He has a 19.50 pack-year smoking history. He has never used smokeless tobacco. He reports current drug use. Drug: Marijuana. He reports that he does not drink alcohol.  ALLERGIES:  No Known Allergies  ROS: Pertinent items noted in HPI and remainder of comprehensive ROS otherwise negative.  HOME MEDS: Current Outpatient Medications on File Prior to Visit  Medication Sig Dispense Refill   allopurinol (ZYLOPRIM) 100 MG tablet Take 100 mg by mouth daily.   2   aspirin 81 MG tablet Take 81 mg by mouth daily.     atorvastatin (LIPITOR) 80 MG tablet Take 80 mg by mouth daily.     clopidogrel (PLAVIX) 75 MG tablet Take 37.5 mg by mouth daily. Pt takes 1/2 pill     irbesartan (AVAPRO) 300 MG tablet Take 300 mg by mouth daily.     metFORMIN (GLUCOPHAGE-XR) 750 MG 24 hr tablet Take 750 mg by mouth in the morning and at bedtime.      metoprolol tartrate (LOPRESSOR) 50 MG tablet Take 1 tablet by mouth twice daily 90 tablet 3   nitroGLYCERIN (NITROSTAT) 0.4 MG SL tablet Place 0.4 mg under the tongue every 5 (five) minutes as needed for chest pain.     No current facility-administered medications on file prior to visit.    LABS/IMAGING: No results found for this or any previous visit (from the past 48 hour(s)). No results found.  LIPID PANEL: No results found for: "CHOL", "TRIG", "HDL", "CHOLHDL", "VLDL", "LDLCALC", "LDLDIRECT"  WEIGHTS: Wt Readings from Last 3 Encounters:  07/06/22 221 lb 9.6 oz (100.5 kg)  09/03/20 239 lb (108.4 kg)  08/04/20 245 lb (111.1 kg)    VITALS: BP 122/70   Pulse 79   Ht 5\' 11"  (1.803 m)   Wt 221 lb 9.6 oz (100.5 kg)    SpO2 97%   BMI 30.91 kg/m   EXAM: General appearance: alert and no distress Neck: no carotid bruit, no JVD, and thyroid not enlarged, symmetric, no tenderness/mass/nodules Lungs: clear to auscultation bilaterally Heart: regular rate and rhythm, S1, S2 normal, no murmur, click, rub or gallop Abdomen: soft, non-tender; bowel sounds normal; no masses,  no organomegaly Extremities: extremities normal, atraumatic, no cyanosis or edema Pulses: 2+ and  symmetric Skin: Skin color, texture, turgor normal. No rashes or lesions Neurologic: Grossly normal Psych: Pleasant  EKG: Sinus rhythm with PVCs at 77-personally reviewed  ASSESSMENT: CAD with a history of unstable angina-status post coronary stenting (Harwani, last 1998) Type 2 diabetes-hemoglobin A1c 6.2% Hypertension Dyslipidemia, goal LDL less than 70 Gout Frequent PVCs-asymptomatic  PLAN: 1.   Mr. Kenley continues to do well without any chest pain or worsening shortness of breath.  Blood pressure is well controlled.  Cholesterol and diabetes are also well controlled.  I advised him to stop his clopidogrel as there is probably little evidence for ongoing therapy at this point especially at 1/2 tablet dose.  He also notes more frequent bruising and the bleeding risk at this point outweighs the benefit.  Continue low-dose aspirin and I will remove nitroglycerin from his list since he has not filled or taken it in years.  Follow-up with me annually or sooner as necessary.  Chrystie Nose, MD, [facility], FACP  [place]  [facility]  Medical Director of the [facility] Lipid [facility] &  Cardiovascular Risk Reduction Clinic Diplomate of the American Board of Clinical Lipidology  Attending Cardiologist  Direct Dial: [phone]  Fax: [phone]  Website:  [email or web address] Nicely Keyaan Lederman 07/06/2022, 8:10 AM

## 2022-07-06 NOTE — Patient Instructions (Signed)
Medication Instructions:  Dr. Rennis Golden has advised that you can STOP clopidogrel (plavix) Nitroglycerin has been removed from your med list   *If you need a refill on your cardiac medications before your next appointment, please call your pharmacy*    Follow-Up: At [facility], you and your health needs are our priority.  As part of our continuing mission to provide you with exceptional heart care, we have created designated Provider Care Teams.  These Care Teams include your primary Cardiologist (physician) and Advanced Practice Providers (APPs -  Physician Assistants and Nurse Practitioners) who all work together to provide you with the care you need, when you need it.  We recommend signing up for the patient portal called "MyChart".  Sign up information is provided on this After Visit Summary.  MyChart is used to connect with patients for Virtual Visits (Telemedicine).  Patients are able to view lab/test results, encounter notes, upcoming appointments, etc.  Non-urgent messages can be sent to your provider as well.   To learn more about what you can do with MyChart, go to [email or web address].    Your next appointment:   12 month(s)  The format for your next appointment:   In Person  Provider:   Chrystie Nose, MD

## 2022-07-19 DIAGNOSIS — M79643 Pain in unspecified hand: Secondary | ICD-10-CM | POA: Diagnosis not present

## 2022-07-19 DIAGNOSIS — M25561 Pain in right knee: Secondary | ICD-10-CM | POA: Diagnosis not present

## 2022-07-19 DIAGNOSIS — M1990 Unspecified osteoarthritis, unspecified site: Secondary | ICD-10-CM | POA: Diagnosis not present

## 2022-07-19 DIAGNOSIS — M109 Gout, unspecified: Secondary | ICD-10-CM | POA: Diagnosis not present

## 2022-09-08 DIAGNOSIS — E1121 Type 2 diabetes mellitus with diabetic nephropathy: Secondary | ICD-10-CM | POA: Diagnosis not present

## 2022-09-08 DIAGNOSIS — Z125 Encounter for screening for malignant neoplasm of prostate: Secondary | ICD-10-CM | POA: Diagnosis not present

## 2022-09-08 DIAGNOSIS — E7800 Pure hypercholesterolemia, unspecified: Secondary | ICD-10-CM | POA: Diagnosis not present

## 2022-09-13 DIAGNOSIS — Z0000 Encounter for general adult medical examination without abnormal findings: Secondary | ICD-10-CM | POA: Diagnosis not present

## 2022-09-13 DIAGNOSIS — N1831 Chronic kidney disease, stage 3a: Secondary | ICD-10-CM | POA: Diagnosis not present

## 2022-09-13 DIAGNOSIS — E1121 Type 2 diabetes mellitus with diabetic nephropathy: Secondary | ICD-10-CM | POA: Diagnosis not present

## 2022-09-13 DIAGNOSIS — M109 Gout, unspecified: Secondary | ICD-10-CM | POA: Diagnosis not present

## 2022-12-04 ENCOUNTER — Other Ambulatory Visit: Payer: Self-pay | Admitting: Internal Medicine

## 2023-03-03 ENCOUNTER — Other Ambulatory Visit: Payer: Self-pay | Admitting: Internal Medicine

## 2023-04-28 ENCOUNTER — Other Ambulatory Visit: Payer: Self-pay | Admitting: Internal Medicine

## 2023-06-14 ENCOUNTER — Other Ambulatory Visit: Payer: Self-pay | Admitting: Internal Medicine

## 2023-07-30 ENCOUNTER — Other Ambulatory Visit: Payer: Self-pay | Admitting: Internal Medicine

## 2023-08-25 ENCOUNTER — Encounter (HOSPITAL_COMMUNITY): Payer: Self-pay | Admitting: *Deleted

## 2023-08-25 ENCOUNTER — Other Ambulatory Visit: Payer: Self-pay

## 2023-08-25 ENCOUNTER — Emergency Department (HOSPITAL_COMMUNITY)
Admission: EM | Admit: 2023-08-25 | Discharge: 2023-08-25 | Disposition: A | Discharge status: Home / Self Care | Payer: Medicare Other | Attending: Emergency Medicine | Admitting: Emergency Medicine

## 2023-08-25 DIAGNOSIS — K5641 Fecal impaction: Secondary | ICD-10-CM | POA: Diagnosis not present

## 2023-08-25 DIAGNOSIS — Z7982 Long term (current) use of aspirin: Secondary | ICD-10-CM | POA: Insufficient documentation

## 2023-08-25 DIAGNOSIS — K5901 Slow transit constipation: Secondary | ICD-10-CM

## 2023-08-25 DIAGNOSIS — K5900 Constipation, unspecified: Secondary | ICD-10-CM | POA: Diagnosis present

## 2023-08-25 LAB — CBC
HCT: 39.8 % (ref 39.0–52.0)
Hemoglobin: 12.5 g/dL — ABNORMAL LOW (ref 13.0–17.0)
MCH: 29.3 pg (ref 26.0–34.0)
MCHC: 31.4 g/dL (ref 30.0–36.0)
MCV: 93.2 fL (ref 80.0–100.0)
Platelets: 230 10*3/uL (ref 150–400)
RBC: 4.27 MIL/uL (ref 4.22–5.81)
RDW: 14.6 % (ref 11.5–15.5)
WBC: 7.9 10*3/uL (ref 4.0–10.5)
nRBC: 0 % (ref 0.0–0.2)

## 2023-08-25 LAB — COMPREHENSIVE METABOLIC PANEL
ALT: 16 U/L (ref 0–44)
AST: 17 U/L (ref 15–41)
Albumin: 3.7 g/dL (ref 3.5–5.0)
Alkaline Phosphatase: 59 U/L (ref 38–126)
Anion gap: 14 (ref 5–15)
BUN: 17 mg/dL (ref 8–23)
CO2: 20 mmol/L — ABNORMAL LOW (ref 22–32)
Calcium: 9.1 mg/dL (ref 8.9–10.3)
Chloride: 102 mmol/L (ref 98–111)
Creatinine, Ser: 1.65 mg/dL — ABNORMAL HIGH (ref 0.61–1.24)
GFR, Estimated: 42 mL/min — ABNORMAL LOW (ref >60)
Glucose, Bld: 106 mg/dL — ABNORMAL HIGH (ref 70–99)
Potassium: 4 mmol/L (ref 3.5–5.1)
Sodium: 136 mmol/L (ref 135–145)
Total Bilirubin: 0.5 mg/dL (ref 0.3–1.2)
Total Protein: 7.1 g/dL (ref 6.5–8.1)

## 2023-08-25 LAB — URINALYSIS, ROUTINE W REFLEX MICROSCOPIC
Bilirubin Urine: NEGATIVE
Glucose, UA: NEGATIVE mg/dL
Hgb urine dipstick: NEGATIVE
Ketones, ur: NEGATIVE mg/dL
Nitrite: NEGATIVE
Protein, ur: NEGATIVE mg/dL
Specific Gravity, Urine: 1.019 (ref 1.005–1.030)
pH: 5 (ref 5.0–8.0)

## 2023-08-25 LAB — LIPASE, BLOOD: Lipase: 43 U/L (ref 11–51)

## 2023-08-25 MED ORDER — POLYETHYLENE GLYCOL 3350 17 G PO PACK: 17.0000 g | PACK | Freq: Every day | ORAL | 0 refills | Status: AC | PRN

## 2023-08-25 MED ORDER — DOCUSATE SODIUM 100 MG PO CAPS: 100.0000 mg | ORAL_CAPSULE | Freq: Two times a day (BID) | ORAL | 0 refills | Status: AC

## 2023-08-25 NOTE — ED Triage Notes (Signed)
The pt has been constipated for 3 days he was just placed on a new med and he reports that he had no problem until he started taking this new med

## 2023-08-25 NOTE — ED Provider Notes (Signed)
[place] EMERGENCY DEPARTMENT AT [facility] Provider Note   CSN: [id number] Arrival date & time: 08/25/23  0034     History  Chief Complaint  Patient presents with   Constipation    Logan Cisneros is a 77 y.o. male.  Patient presents to the emergency department for evaluation of constipation.  Patient has not had a bowel movement for 3 days.  He reports that he has the urge to have a bowel movement but cannot.  Earlier today he passed a couple of small hard balls.  He has not had any abdominal pain, nausea, vomiting.       Home Medications Prior to Admission medications   Medication Sig Start Date End Date Taking? Authorizing Provider  docusate sodium (COLACE) 100 MG capsule Take 1 capsule (100 mg total) by mouth every 12 (twelve) hours. 08/25/23  Yes Lyam Provencio, Canary Brim, MD  polyethylene glycol (MIRALAX) 17 g packet Take 17 g by mouth daily as needed for moderate constipation or severe constipation. 08/25/23  Yes Londin Antone, Canary Brim, MD  allopurinol (ZYLOPRIM) 100 MG tablet Take 100 mg by mouth daily.  02/21/15   [provider]  aspirin 81 MG tablet Take 81 mg by mouth daily.    [provider]  atorvastatin (LIPITOR) 80 MG tablet Take 80 mg by mouth daily.    [provider]  irbesartan (AVAPRO) 300 MG tablet Take 300 mg by mouth daily. 09/03/19   [provider]  metFORMIN (GLUCOPHAGE-XR) 750 MG 24 hr tablet Take 750 mg by mouth in the morning and at bedtime.  10/21/19   [provider]  metoprolol tartrate (LOPRESSOR) 50 MG tablet Take 1 tablet (50 mg total) by mouth 2 (two) times daily. NEED OV. 07/31/23   Hilty, Lisette Abu, MD      Allergies    Patient has no known allergies.    Review of Systems   Review of Systems  Physical Exam Updated Vital Signs BP 138/88 (BP Location: Right Arm)   Pulse 72   Temp 97.9 F (36.6 C)   Resp 18   Ht 5\' 11"  (1.803 m)   Wt 100.5 kg   SpO2 100%   BMI 30.90 kg/m   Physical Exam Vitals and nursing note reviewed. Exam conducted with a chaperone present.  Constitutional:      General: He is not in acute distress.    Appearance: He is well-developed.  HENT:     Head: Normocephalic and atraumatic.     Mouth/Throat:     Mouth: Mucous membranes are moist.  Eyes:     General: Vision grossly intact. Gaze aligned appropriately.     Extraocular Movements: Extraocular movements intact.     Conjunctiva/sclera: Conjunctivae normal.  Cardiovascular:     Rate and Rhythm: Normal rate and regular rhythm.     Pulses: Normal pulses.     Heart sounds: Normal heart sounds, S1 normal and S2 normal. No murmur heard.    No friction rub. No gallop.  Pulmonary:     Effort: Pulmonary effort is normal. No respiratory distress.     Breath sounds: Normal breath sounds.  Abdominal:     Palpations: Abdomen is soft.     Tenderness: There is no abdominal tenderness. There is no guarding or rebound.     Hernia: No hernia is present.  Genitourinary:    Comments: Large clay-like impaction Musculoskeletal:        General: No swelling.     Cervical back:  Full passive range of motion without pain, normal range of motion and neck supple. No pain with movement, spinous process tenderness or muscular tenderness. Normal range of motion.     Right lower leg: No edema.     Left lower leg: No edema.  Skin:    General: Skin is warm and dry.     Capillary Refill: Capillary refill takes less than 2 seconds.     Findings: No ecchymosis, erythema, lesion or wound.  Neurological:     Mental Status: He is alert and oriented to person, place, and time.     GCS: GCS eye subscore is 4. GCS verbal subscore is 5. GCS motor subscore is 6.     Cranial Nerves: Cranial nerves 2-12 are intact.     Sensory: Sensation is intact.     Motor: Motor function is intact. No weakness or abnormal muscle tone.     Coordination: Coordination is intact.  Psychiatric:        Mood and Affect: Mood normal.         Speech: Speech normal.        Behavior: Behavior normal.     ED Results / Procedures / Treatments   Labs (all labs ordered are listed, but only abnormal results are displayed) Labs Reviewed  COMPREHENSIVE METABOLIC PANEL - Abnormal; Notable for the following components:      Result Value   CO2 20 (*)    Glucose, Bld 106 (*)    Creatinine, Ser 1.65 (*)    GFR, Estimated 42 (*)    All other components within normal limits  CBC - Abnormal; Notable for the following components:   Hemoglobin 12.5 (*)    All other components within normal limits  URINALYSIS, ROUTINE W REFLEX MICROSCOPIC - Abnormal; Notable for the following components:   APPearance HAZY (*)    Leukocytes,Ua SMALL (*)    Bacteria, UA RARE (*)    All other components within normal limits  LIPASE, BLOOD    EKG None  Radiology No results found.  Procedures Procedures    Medications Ordered in ED Medications - No data to display  ED Course/ Medical Decision Making/ A&P                                 Medical Decision Making  Presents to the emergency department with concerns over constipation.  Patient has not had a bowel movement in 3 days.  He has fullness in his rectum and senses a need to have a bowel movement but cannot.  This is unusual, he is normally very regular.  Patient not experiencing any abdominal pain.  Abdominal exam is benign.  Rectal exam reveals a large soft and claylike impaction.  This was removed as best as possible by myself and then patient given an enema.  Patient has had a large bowel movement with enema and feels relief.        Final Clinical Impression(s) / ED Diagnoses Final diagnoses:  Fecal impaction in rectum (HCC)  Slow transit constipation    Rx / DC Orders ED Discharge Orders          Ordered    docusate sodium (COLACE) 100 MG capsule  Every 12 hours        08/25/23 0715    polyethylene glycol (MIRALAX) 17 g packet  Daily PRN        08/25/23 0715  Gilda Crease, MD 08/25/23 [id number]

## 2023-08-25 NOTE — ED Notes (Signed)
Large BM results from enema, Dr Blinda Leatherwood notified.

## 2023-09-07 ENCOUNTER — Telehealth: Payer: Self-pay

## 2023-09-07 NOTE — Telephone Encounter (Signed)
Transition Care Management Unsuccessful Follow-up Telephone Call  Date of discharge and from where:  08/25/2023 [facility]  Attempts:  1st Attempt  Reason for unsuccessful TCM follow-up call:  Left voice message  Vandy Tsuchiya [facility]  [facility], [facility] Resource Care Guide Direct Dial: [phone]  Website: [email or web address]

## 2023-09-10 ENCOUNTER — Telehealth: Payer: Self-pay

## 2023-09-10 NOTE — Telephone Encounter (Signed)
Transition Care Management Unsuccessful Follow-up Telephone Call  Date of discharge and from where:  08/25/2023 [facility]  Attempts:  2nd Attempt  Reason for unsuccessful TCM follow-up call:  Left voice message  Frederick Klinger [facility]  [facility], [facility] Resource Care Guide Direct Dial: [phone]  Website: [email or web address]

## 2023-10-11 ENCOUNTER — Other Ambulatory Visit: Payer: Self-pay | Admitting: Internal Medicine

## 2023-11-12 ENCOUNTER — Other Ambulatory Visit: Payer: Self-pay | Admitting: Internal Medicine

## 2023-11-30 ENCOUNTER — Other Ambulatory Visit: Payer: Self-pay | Admitting: Internal Medicine

## 2023-12-16 ENCOUNTER — Other Ambulatory Visit: Payer: Self-pay | Admitting: Internal Medicine

## 2023-12-18 ENCOUNTER — Telehealth: Payer: Self-pay | Admitting: Internal Medicine

## 2023-12-18 NOTE — Telephone Encounter (Signed)
*  STAT* If patient is at the pharmacy, call can be transferred to refill team.   1. Which medications need to be refilled? (please list name of each medication and dose if known)   metoprolol tartrate (LOPRESSOR) 50 MG tablet   2. Would you like to learn more about the convenience, safety, & potential cost savings by using the [facility]?   3. Are you open to using the [facility] (Type [facility]. ).  4. Which pharmacy/location (including street and city if local pharmacy) is medication to be sent to?  [facility] - [place], [place] - [address]   5. Do they need a 30 day or 90 day supply?   30 day  Patient stated he has 3 tablets left.  Patient has appointment scheduled on 3/26.

## 2023-12-19 MED ORDER — METOPROLOL TARTRATE 50 MG PO TABS: 50.0000 mg | ORAL_TABLET | Freq: Two times a day (BID) | ORAL | 0 refills | Status: DC

## 2024-01-15 NOTE — Progress Notes (Unsigned)
  Cardiology Office Note   Date:  01/16/2024  ID:  Logan Cisneros, Logan Cisneros June 29, 1946, MRN [id number] PCP:  Merri Brunette, MD [facility] Cardiologist: Chrystie Nose, MD  Reason for visit: 1 year follow-up  History of Present Illness    Logan Cisneros is a 78 y.o. male with a hx of CAD with history of remote PCI, diabetes, dyslipidemia, CKD, hypertension, PVCs, gout.  He was last seen by Dr. Rennis Golden in September 2023.  Is doing well.  Asymptomatic regarding his PVCs.  Blood pressure well-controlled.  Stopped Plavix at that visit, continued on low-dose aspirin.  Today, patient states he is doing great.  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  He denies chest pain, shortness of breath, palpitations, significant lightheadedness or significant pedal edema.  He is tolerating his medications well.  He has no current concerns.     Objective / Physical Exam   EKG today: Normal sinus rhythm, heart rate 80  Vital signs:  BP 126/66 (BP Location: Left Arm, Patient Position: Sitting)   Pulse 76   Ht 5\' 11"  (1.803 m)   Wt 210 lb (95.3 kg)   SpO2 99%   BMI 29.29 kg/m     GEN: No acute distress NECK: No carotid bruits CARDIAC: RRR with ectopics, no murmurs RESPIRATORY:  Clear to auscultation without rales, wheezing or rhonchi  EXTREMITIES: Trace ankle edema  Assessment and Plan   Coronary artery disease, no angina -Hx of PCI (Harwani, last 1998)  -Continue aspirin 81 mg daily and metoprolol tartrate 50 mg twice daily. -Continue with lipid therapy (Lipitor 80 mg daily) with LDL goal less than 70.  PVCs, asymptomatic -EKG with normal sinus rhythm, heart rate 80. -Continue metoprolol tartrate 50 mg twice daily.  Hypertension, well-controlled -Continue Avapro 300 mg daily.  Will check BMET today -patient had AKI at November 2024 ED visit, do not  see follow-up metabolic panel. -Goal BP is <130/80.  Recommend DASH diet (high in vegetables, fruits, low-fat dairy products, whole grains, poultry, fish, and nuts and low in sweets, sugar-sweetened beverages, and red meats), salt restriction and increase physical activity.  Hyperlipidemia -LDL 64 in November 2024.  Continue Lipitor 80 mg daily. -Recommend cholesterol lowering diets - Mediterranean diet, DASH diet, vegetarian diet, low-carbohydrate diet and avoidance of trans fats.  Discussed healthier choice substitutes.  Nuts, high-fiber foods, and fiber supplements may also improve lipids.    Disposition - Follow-up in 1 year.   Signed, Cannon Kettle, PA-C  01/16/2024 [facility]

## 2024-01-16 ENCOUNTER — Ambulatory Visit: Payer: Medicare Other | Attending: Physician Assistant | Admitting: Physician Assistant

## 2024-01-16 ENCOUNTER — Other Ambulatory Visit: Payer: Self-pay

## 2024-01-16 ENCOUNTER — Encounter: Payer: Self-pay | Admitting: Physician Assistant

## 2024-01-16 VITALS — BP 126/66 | HR 76 | Ht 71.0 in | Wt 210.0 lb

## 2024-01-16 DIAGNOSIS — I10 Essential (primary) hypertension: Secondary | ICD-10-CM

## 2024-01-16 DIAGNOSIS — E782 Mixed hyperlipidemia: Secondary | ICD-10-CM

## 2024-01-16 DIAGNOSIS — I493 Ventricular premature depolarization: Secondary | ICD-10-CM

## 2024-01-16 DIAGNOSIS — I2510 Atherosclerotic heart disease of native coronary artery without angina pectoris: Secondary | ICD-10-CM | POA: Diagnosis not present

## 2024-01-16 LAB — BASIC METABOLIC PANEL
BUN/Creatinine Ratio: 15 (ref 10–24)
BUN: 22 mg/dL (ref 8–27)
CO2: 21 mmol/L (ref 20–29)
Calcium: 9.5 mg/dL (ref 8.6–10.2)
Chloride: 106 mmol/L (ref 96–106)
Creatinine, Ser: 1.48 mg/dL — ABNORMAL HIGH (ref 0.76–1.27)
Glucose: 86 mg/dL (ref 70–99)
Potassium: 5.3 mmol/L — ABNORMAL HIGH (ref 3.5–5.2)
Sodium: 141 mmol/L (ref 134–144)
eGFR: 48 mL/min/{1.73_m2} — ABNORMAL LOW (ref >59)

## 2024-01-16 NOTE — Patient Instructions (Signed)
 Medication Instructions:  No changes *If you need a refill on your cardiac medications before your next appointment, please call your pharmacy*   Lab Work: BMET If you have labs (blood work) drawn today and your tests are completely normal, you will receive your results only by: MyChart Message (if you have MyChart) OR A paper copy in the mail If you have any lab test that is abnormal or we need to change your treatment, we will call you to review the results.   Testing/Procedures: No Testing   Follow-Up: At [facility], you and your health needs are our priority.  As part of our continuing mission to provide you with exceptional heart care, we have created designated Provider Care Teams.  These Care Teams include your primary Cardiologist (physician) and Advanced Practice Providers (APPs -  Physician Assistants and Nurse Practitioners) who all work together to provide you with the care you need, when you need it.  We recommend signing up for the patient portal called "MyChart".  Sign up information is provided on this After Visit Summary.  MyChart is used to connect with patients for Virtual Visits (Telemedicine).  Patients are able to view lab/test results, encounter notes, upcoming appointments, etc.  Non-urgent messages can be sent to your provider as well.   To learn more about what you can do with MyChart, go to [email or web address].    Your next appointment:   1 year(s)  Provider:   Chrystie Nose, MD    Other Instructions   1st Floor: - Lobby - Registration  - Pharmacy  - Lab - Cafe  2nd Floor: - PV Lab - Diagnostic Testing (echo, CT, nuclear med)  3rd Floor: - Vacant  4th Floor: - TCTS (cardiothoracic surgery) - AFib Clinic - Structural Heart Clinic - Vascular Surgery  - Vascular Ultrasound  5th Floor: - HeartCare Cardiology (general and EP) - Clinical Pharmacy for coumadin, hypertension, lipid, weight-loss medications, and med management  appointments    Valet parking services will be available as well.

## 2024-01-23 ENCOUNTER — Telehealth: Payer: Self-pay

## 2024-01-23 DIAGNOSIS — I493 Ventricular premature depolarization: Secondary | ICD-10-CM

## 2024-01-23 DIAGNOSIS — I2510 Atherosclerotic heart disease of native coronary artery without angina pectoris: Secondary | ICD-10-CM

## 2024-01-23 NOTE — Telephone Encounter (Addendum)
 Called patient regarding results. Patient had understanding of results. Patient advised to decrease ibresartan to 150 mg daily . Patient also advised to repeat BMET in 2 weeks . Patient had understanding of results.----- Message from Cannon Kettle sent at 01/18/2024  9:14 PM EDT ----- Creatinine (kidney function) improved from last check from 1.65 to 1.48.   Potassium slightly high at 5.3.  Recommend: -Decrease Avapro to 1/2 tablet daily (150mg  daily) and monitor blood pressure. -Ensure you are not taking supplements containing potassium. -Consider decrease intake of high potassium foods (bananas, oranges, avocados, tomatoes, raisins, and cantaloupe; potatoes, Spinach, Brussels sprouts, Winter squash; Lentils, Nuts and seeds (e.g., almonds, cashews), Milk (especially low-fat and skim), Whole grains (e.g., quinoa, brown rice) -Recheck BMET in 2 weeks.

## 2024-02-09 ENCOUNTER — Other Ambulatory Visit: Payer: Self-pay | Admitting: Internal Medicine

## 2024-09-25 LAB — LAB REPORT - SCANNED
A1c: 5.8
Albumin, Urine POC: <3
Albumin/Creatinine Ratio, Urine, POC: <3
Creatinine, POC: 99.7 mg/dL
EGFR: 50
